# Patient Record
Sex: Male | Born: 1965 | Race: White | Hispanic: No | Marital: Married | Smoking: Never smoker
Health system: Southern US, Community
[De-identification: ages and names within clinical notes are randomized; demographics above are authoritative.]

## PROBLEM LIST (undated history)

## (undated) DIAGNOSIS — T4145XA Adverse effect of unspecified anesthetic, initial encounter: Secondary | ICD-10-CM

## (undated) DIAGNOSIS — M1990 Unspecified osteoarthritis, unspecified site: Secondary | ICD-10-CM

## (undated) DIAGNOSIS — K219 Gastro-esophageal reflux disease without esophagitis: Secondary | ICD-10-CM

## (undated) DIAGNOSIS — Z87442 Personal history of urinary calculi: Secondary | ICD-10-CM

## (undated) DIAGNOSIS — T8859XA Other complications of anesthesia, initial encounter: Secondary | ICD-10-CM

## (undated) DIAGNOSIS — G4730 Sleep apnea, unspecified: Secondary | ICD-10-CM

## (undated) DIAGNOSIS — S83209A Unspecified tear of unspecified meniscus, current injury, unspecified knee, initial encounter: Secondary | ICD-10-CM

## (undated) HISTORY — PX: OTHER SURGICAL HISTORY: SHX169

---

## 1987-06-24 HISTORY — PX: FOOT SURGERY: SHX648

## 1997-06-23 HISTORY — PX: KNEE ARTHROSCOPY: SUR90

## 2004-06-23 HISTORY — PX: APPENDECTOMY: SHX54

## 2014-05-16 ENCOUNTER — Ambulatory Visit: Payer: Self-pay | Admitting: Orthopedic Surgery

## 2014-05-16 NOTE — Progress Notes (Signed)
Preoperative surgical orders have been place into the Epic hospital system for Reginald Butler on 05/16/2014, 1:43 PM  by Patrica DuelPERKINS, ALEXZANDREW for surgery on 05-31-14.  Preop Knee Scope orders including IV Tylenol and IV Decadron as long as there are no contraindications to the above medications. Avel Peacerew Perkins, PA-C

## 2014-05-16 NOTE — Patient Instructions (Addendum)
Reginald Butler  05/16/2014                           YOUR PROCEDURE IS SCHEDULED ON:  05/31/14                ENTER FROM [address] - GO TO [address] DECK               LOOK FOR [facility]  / [employer]                              FOLLOW  SIGNS TO SHORT STAY CENTER                 ARRIVE AT SHORT STAY AT:  10:30 AM               CALL THIS NUMBER IF ANY PROBLEMS THE DAY OF SURGERY :               [phone]                                REMEMBER:   Do not eat food or drink liquids AFTER MIDNIGHT                  Take these medicines the morning of surgery with               A SIPS OF WATER :  OMEPRAZOLE              BRING C PAP MASK AND TUBING TO HOSPITAL       Do not wear jewelry, make-up   Do not wear lotions, powders, or perfumes.   Do not shave legs or underarms 12 hrs. before surgery (men may shave face)  Do not bring valuables to the hospital.  Contacts, dentures or bridgework may not be worn into surgery.  Leave suitcase in the car. After surgery it may be brought to your room.  For patients admitted to the hospital more than one night, checkout time is            11:00 AM                                                       The day of discharge.   Patients discharged the day of surgery will not be allowed to drive home.            If going home same day of surgery, must have someone stay with you              FIRST 24 hrs at home and arrange for some one to drive you              home from hospital.   ________________________________________________________________________  [facility] - PREPARING FOR SURGERY  Before surgery, you can play an important role.  Because skin is not sterile, your skin needs to be as free of germs as possible.  You can reduce the number of germs on your skin by washing with CHG (chlorahexidine gluconate) soap before  surgery.  CHG is an antiseptic cleaner which kills germs and bonds with the skin to continue killing germs even after washing. Please DO NOT use if you have an allergy to CHG or antibacterial soaps.  If your skin becomes reddened/irritated stop using the CHG and inform your nurse when you arrive at Short Stay. Do not shave (including legs and underarms) for at least 48 hours prior to the first CHG shower.  You may shave your face. Please follow these instructions carefully:   1.  Shower with CHG Soap the night before surgery and the  morning of Surgery.   2.  If you choose to wash your hair, wash your hair first as usual with your  normal  Shampoo.   3.  After you shampoo, rinse your hair and body thoroughly to remove the  shampoo.                                         4.  Use CHG as you would any other liquid soap.  You can apply chg directly  to the skin and wash . Gently wash with scrungie or clean wascloth    5.  Apply the CHG Soap to your body ONLY FROM THE NECK DOWN.   Do not use on open                           Wound or open sores. Avoid contact with eyes, ears mouth and genitals (private parts).                        Genitals (private parts) with your normal soap.              6.  Wash thoroughly, paying special attention to the area where your surgery  will be performed.   7.  Thoroughly rinse your body with warm water from the neck down.   8.  DO NOT shower/wash with your normal soap after using and rinsing off  the CHG Soap .                9.  Pat yourself dry with a clean towel.             10.  Wear clean pajamas.             11.  Place clean sheets on your bed the night of your first shower and do not  sleep with pets.  Day of Surgery : Do not apply any lotions/deodorants the morning of surgery.  Please wear clean clothes to the hospital/surgery center.  FAILURE TO FOLLOW THESE INSTRUCTIONS MAY RESULT IN THE CANCELLATION OF YOUR SURGERY    PATIENT  SIGNATURE_________________________________  ______________________________________________________________________     Reginald MireIncentive Spirometer  An incentive spirometer is a tool that can help keep your lungs clear and active. This tool measures how well you are filling your lungs with each breath. Taking long deep breaths may help reverse or decrease the chance of developing breathing (pulmonary) problems (especially infection) following:  A long period of time when you are unable to move or be active. BEFORE THE PROCEDURE   If the spirometer includes an indicator to show your best effort, your nurse or respiratory therapist will set it to a desired goal.  If possible, sit up straight or lean slightly forward. Try not to slouch.  Hold the incentive spirometer in an upright position. INSTRUCTIONS FOR USE  1. Sit on the edge of your bed if possible, or sit up as far as you can in bed or on a chair. 2. Hold the incentive spirometer in an upright position. 3. Breathe out normally. 4. Place the mouthpiece in your mouth and seal your lips tightly around it. 5. Breathe in slowly and as deeply as possible, raising the piston or the ball toward the top of the column. 6. Hold your breath for 3-5 seconds or for as long as possible. Allow the piston or ball to fall to the bottom of the column. 7. Remove the mouthpiece from your mouth and breathe out normally. 8. Rest for a few seconds and repeat Steps 1 through 7 at least 10 times every 1-2 hours when you are awake. Take your time and take a few normal breaths between deep breaths. 9. The spirometer may include an indicator to show your best effort. Use the indicator as a goal to work toward during each repetition. 10. After each set of 10 deep breaths, practice coughing to be sure your lungs are clear. If you have an incision (the cut made at the time of surgery), support your incision when coughing by placing a pillow or rolled up towels firmly  against it. Once you are able to get out of bed, walk around indoors and cough well. You may stop using the incentive spirometer when instructed by your caregiver.  RISKS AND COMPLICATIONS  Take your time so you do not get dizzy or light-headed.  If you are in pain, you may need to take or ask for pain medication before doing incentive spirometry. It is harder to take a deep breath if you are having pain. AFTER USE  Rest and breathe slowly and easily.  It can be helpful to keep track of a log of your progress. Your caregiver can provide you with a simple table to help with this. If you are using the spirometer at home, follow these instructions: [place] IF:   You are having difficultly using the spirometer.  You have trouble using the spirometer as often as instructed.  Your pain medication is not giving enough relief while using the spirometer.  You develop fever of 100.5 F (38.1 C) or higher. SEEK IMMEDIATE MEDICAL CARE IF:   You cough up bloody sputum that had not been present before.  You develop fever of 102 F (38.9 C) or greater.  You develop worsening pain at or near the incision site. MAKE SURE YOU:   Understand these instructions.  Will watch your condition.  Will get help right away if you are not doing well or get worse. Document Released: 10/20/2006 Document Revised: 09/01/2011 Document Reviewed: 12/21/2006 [employer] Patient Information 2014 [place], [place].   ________________________________________________________________________

## 2014-05-17 ENCOUNTER — Encounter (HOSPITAL_COMMUNITY): Payer: Self-pay

## 2014-05-17 ENCOUNTER — Encounter (HOSPITAL_COMMUNITY)
Admission: RE | Admit: 2014-05-17 | Discharge: 2014-05-17 | Disposition: A | Discharge status: Home / Self Care | Payer: BC Managed Care – PPO | Source: Ambulatory Visit | Attending: Orthopedic Surgery | Admitting: Orthopedic Surgery

## 2014-05-17 DIAGNOSIS — Z01818 Encounter for other preprocedural examination: Secondary | ICD-10-CM | POA: Insufficient documentation

## 2014-05-17 HISTORY — DX: Unspecified osteoarthritis, unspecified site: M19.90

## 2014-05-17 HISTORY — DX: Sleep apnea, unspecified: G47.30

## 2014-05-17 HISTORY — DX: Other complications of anesthesia, initial encounter: T88.59XA

## 2014-05-17 HISTORY — DX: Adverse effect of unspecified anesthetic, initial encounter: T41.45XA

## 2014-05-17 HISTORY — DX: Unspecified tear of unspecified meniscus, current injury, unspecified knee, initial encounter: S83.209A

## 2014-05-17 HISTORY — DX: Personal history of urinary calculi: Z87.442

## 2014-05-17 HISTORY — DX: Gastro-esophageal reflux disease without esophagitis: K21.9

## 2014-05-30 MED ORDER — DEXTROSE 5 % IV SOLN
3.0000 g | INTRAVENOUS | Status: AC
  Administered 2014-05-31: 2 g via INTRAVENOUS
  Filled 2014-05-30 (×2): qty 3000

## 2014-05-31 ENCOUNTER — Ambulatory Visit (HOSPITAL_COMMUNITY): Payer: BC Managed Care – PPO | Admitting: Anesthesiology

## 2014-05-31 ENCOUNTER — Encounter (HOSPITAL_COMMUNITY): Payer: Self-pay | Admitting: *Deleted

## 2014-05-31 ENCOUNTER — Ambulatory Visit (HOSPITAL_COMMUNITY)
Admission: RE | Admit: 2014-05-31 | Discharge: 2014-05-31 | Disposition: A | Discharge status: Home / Self Care | Payer: BC Managed Care – PPO | Source: Ambulatory Visit | Attending: Orthopedic Surgery | Admitting: Orthopedic Surgery

## 2014-05-31 ENCOUNTER — Encounter (HOSPITAL_COMMUNITY)
Admission: RE | Disposition: A | Discharge status: Home / Self Care | Payer: Self-pay | Source: Ambulatory Visit | Attending: Orthopedic Surgery

## 2014-05-31 DIAGNOSIS — G4730 Sleep apnea, unspecified: Secondary | ICD-10-CM | POA: Insufficient documentation

## 2014-05-31 DIAGNOSIS — M2342 Loose body in knee, left knee: Secondary | ICD-10-CM | POA: Insufficient documentation

## 2014-05-31 DIAGNOSIS — Y999 Unspecified external cause status: Secondary | ICD-10-CM | POA: Diagnosis not present

## 2014-05-31 DIAGNOSIS — S83249A Other tear of medial meniscus, current injury, unspecified knee, initial encounter: Secondary | ICD-10-CM | POA: Diagnosis present

## 2014-05-31 DIAGNOSIS — X58XXXA Exposure to other specified factors, initial encounter: Secondary | ICD-10-CM | POA: Diagnosis not present

## 2014-05-31 DIAGNOSIS — S83242A Other tear of medial meniscus, current injury, left knee, initial encounter: Secondary | ICD-10-CM | POA: Diagnosis not present

## 2014-05-31 DIAGNOSIS — M94262 Chondromalacia, left knee: Secondary | ICD-10-CM | POA: Insufficient documentation

## 2014-05-31 DIAGNOSIS — Y939 Activity, unspecified: Secondary | ICD-10-CM | POA: Diagnosis not present

## 2014-05-31 DIAGNOSIS — Y929 Unspecified place or not applicable: Secondary | ICD-10-CM | POA: Insufficient documentation

## 2014-05-31 DIAGNOSIS — Z6837 Body mass index (BMI) 37.0-37.9, adult: Secondary | ICD-10-CM | POA: Diagnosis not present

## 2014-05-31 DIAGNOSIS — K219 Gastro-esophageal reflux disease without esophagitis: Secondary | ICD-10-CM | POA: Insufficient documentation

## 2014-05-31 HISTORY — PX: KNEE ARTHROSCOPY: SHX127

## 2014-05-31 SURGERY — ARTHROSCOPY, KNEE
Anesthesia: General | Site: Knee | Laterality: Left

## 2014-05-31 MED ORDER — ACETAMINOPHEN 10 MG/ML IV SOLN
1000.0000 mg | Freq: Once | INTRAVENOUS | Status: AC
  Administered 2014-05-31: 1000 mg via INTRAVENOUS
  Filled 2014-05-31: qty 100

## 2014-05-31 MED ORDER — ONDANSETRON HCL 4 MG/2ML IJ SOLN
INTRAMUSCULAR | Status: AC
  Filled 2014-05-31: qty 2

## 2014-05-31 MED ORDER — BUPIVACAINE-EPINEPHRINE (PF) 0.25% -1:200000 IJ SOLN
INTRAMUSCULAR | Status: AC
  Filled 2014-05-31: qty 30

## 2014-05-31 MED ORDER — FENTANYL CITRATE 0.05 MG/ML IJ SOLN
INTRAMUSCULAR | Status: DC
Start: 2014-05-31 — End: 2014-05-31
  Filled 2014-05-31: qty 2

## 2014-05-31 MED ORDER — METHOCARBAMOL 500 MG PO TABS: 500.0000 mg | ORAL_TABLET | Freq: Four times a day (QID) | ORAL | Status: DC

## 2014-05-31 MED ORDER — MIDAZOLAM HCL 5 MG/5ML IJ SOLN
INTRAMUSCULAR | Status: DC | PRN
  Administered 2014-05-31: 2 mg via INTRAVENOUS

## 2014-05-31 MED ORDER — LACTATED RINGERS IR SOLN
Status: DC | PRN
  Administered 2014-05-31: 9000 mL

## 2014-05-31 MED ORDER — DEXAMETHASONE SODIUM PHOSPHATE 10 MG/ML IJ SOLN
INTRAMUSCULAR | Status: AC
  Filled 2014-05-31: qty 1

## 2014-05-31 MED ORDER — FENTANYL CITRATE 0.05 MG/ML IJ SOLN
INTRAMUSCULAR | Status: AC
  Filled 2014-05-31: qty 2

## 2014-05-31 MED ORDER — LIDOCAINE HCL (CARDIAC) 20 MG/ML IV SOLN
INTRAVENOUS | Status: AC
  Filled 2014-05-31: qty 5

## 2014-05-31 MED ORDER — FENTANYL CITRATE 0.05 MG/ML IJ SOLN
25.0000 ug | INTRAMUSCULAR | Status: DC | PRN
  Administered 2014-05-31: 50 ug via INTRAVENOUS

## 2014-05-31 MED ORDER — SODIUM CHLORIDE 0.9 % IV SOLN: INTRAVENOUS | Status: DC

## 2014-05-31 MED ORDER — PROPOFOL 10 MG/ML IV BOLUS
INTRAVENOUS | Status: AC
  Filled 2014-05-31: qty 20

## 2014-05-31 MED ORDER — LIDOCAINE HCL (CARDIAC) 20 MG/ML IV SOLN
INTRAVENOUS | Status: DC | PRN
  Administered 2014-05-31: 50 mg via INTRAVENOUS

## 2014-05-31 MED ORDER — FENTANYL CITRATE 0.05 MG/ML IJ SOLN
INTRAMUSCULAR | Status: DC | PRN
  Administered 2014-05-31: 25 ug via INTRAVENOUS
  Administered 2014-05-31: 50 ug via INTRAVENOUS

## 2014-05-31 MED ORDER — BUPIVACAINE-EPINEPHRINE 0.25% -1:200000 IJ SOLN
INTRAMUSCULAR | Status: DC | PRN
  Administered 2014-05-31: 20 mL

## 2014-05-31 MED ORDER — LACTATED RINGERS IV SOLN
INTRAVENOUS | Status: DC
  Administered 2014-05-31 (×2): 1000 mL via INTRAVENOUS

## 2014-05-31 MED ORDER — HYDROCODONE-ACETAMINOPHEN 5-325 MG PO TABS: 1.0000 | ORAL_TABLET | Freq: Four times a day (QID) | ORAL | Status: DC | PRN

## 2014-05-31 MED ORDER — CHLORHEXIDINE GLUCONATE 4 % EX LIQD: 60.0000 mL | Freq: Once | CUTANEOUS | Status: DC

## 2014-05-31 MED ORDER — DEXAMETHASONE SODIUM PHOSPHATE 10 MG/ML IJ SOLN
10.0000 mg | Freq: Once | INTRAMUSCULAR | Status: AC
  Administered 2014-05-31: 10 mg via INTRAVENOUS

## 2014-05-31 MED ORDER — METHOCARBAMOL 500 MG PO TABS: 500.0000 mg | ORAL_TABLET | Freq: Four times a day (QID) | ORAL | Status: DC | PRN

## 2014-05-31 MED ORDER — PROMETHAZINE HCL 25 MG/ML IJ SOLN: 6.2500 mg | INTRAMUSCULAR | Status: DC | PRN

## 2014-05-31 MED ORDER — KETOROLAC TROMETHAMINE 30 MG/ML IJ SOLN
15.0000 mg | Freq: Once | INTRAMUSCULAR | Status: AC | PRN
  Administered 2014-05-31: 30 mg via INTRAVENOUS
  Filled 2014-05-31: qty 1

## 2014-05-31 MED ORDER — MIDAZOLAM HCL 2 MG/2ML IJ SOLN
INTRAMUSCULAR | Status: AC
  Filled 2014-05-31: qty 2

## 2014-05-31 MED ORDER — PROPOFOL 10 MG/ML IV BOLUS
INTRAVENOUS | Status: DC | PRN
  Administered 2014-05-31: 300 mg via INTRAVENOUS

## 2014-05-31 SURGICAL SUPPLY — 26 items
BANDAGE ELASTIC 6 VELCRO ST LF (GAUZE/BANDAGES/DRESSINGS) ×3 IMPLANT
BLADE 4.2CUDA (BLADE) ×3 IMPLANT
BLADE SURG SZ11 CARB STEEL (BLADE) ×2 IMPLANT
CUFF TOURN SGL QUICK 34 (TOURNIQUET CUFF) ×3
CUFF TRNQT CYL 34X4X40X1 (TOURNIQUET CUFF) ×1 IMPLANT
DRAPE U-SHAPE 47X51 STRL (DRAPES) ×3 IMPLANT
DRSG EMULSION OIL 3X3 NADH (GAUZE/BANDAGES/DRESSINGS) ×3 IMPLANT
DRSG PAD ABDOMINAL 8X10 ST (GAUZE/BANDAGES/DRESSINGS) ×3 IMPLANT
DURAPREP 26ML APPLICATOR (WOUND CARE) ×3 IMPLANT
GAUZE SPONGE 4X4 12PLY STRL (GAUZE/BANDAGES/DRESSINGS) ×3 IMPLANT
GLOVE BIO SURGEON STRL SZ8 (GLOVE) ×3 IMPLANT
GLOVE BIOGEL PI IND STRL 8 (GLOVE) ×1 IMPLANT
GLOVE BIOGEL PI INDICATOR 8 (GLOVE) ×6
GOWN STRL REUS W/TWL LRG LVL3 (GOWN DISPOSABLE) ×5 IMPLANT
KIT BASIN OR (CUSTOM PROCEDURE TRAY) ×3 IMPLANT
MANIFOLD NEPTUNE II (INSTRUMENTS) ×3 IMPLANT
PACK ARTHROSCOPY WL (CUSTOM PROCEDURE TRAY) ×3 IMPLANT
PACK ICE MAXI GEL EZY WRAP (MISCELLANEOUS) ×9 IMPLANT
PADDING CAST COTTON 6X4 STRL (CAST SUPPLIES) ×6 IMPLANT
POSITIONER SURGICAL ARM (MISCELLANEOUS) ×3 IMPLANT
SET ARTHROSCOPY TUBING (MISCELLANEOUS) ×3
SET ARTHROSCOPY TUBING LN (MISCELLANEOUS) ×1 IMPLANT
SUT ETHILON 4 0 PS 2 18 (SUTURE) ×3 IMPLANT
TOWEL OR 17X26 10 PK STRL BLUE (TOWEL DISPOSABLE) ×3 IMPLANT
WAND 90 DEG TURBOVAC W/CORD (SURGICAL WAND) ×2 IMPLANT
WRAP KNEE MAXI GEL POST OP (GAUZE/BANDAGES/DRESSINGS) ×3 IMPLANT

## 2014-05-31 NOTE — Anesthesia Postprocedure Evaluation (Signed)
  Anesthesia Post-op Note  Patient: Reginald Butler  Procedure(s) Performed: Procedure(s) (LRB): LEFT ARTHROSCOPY KNEE WITH MEDIAL MENISCAL  DEBRIDMENT, CHONDROPLASTY (Left)  Patient Location: PACU  Anesthesia Type: General  Level of Consciousness: awake and alert   Airway and Oxygen Therapy: Patient Spontanous Breathing  Post-op Pain: mild  Post-op Assessment: Post-op Vital signs reviewed, Patient's Cardiovascular Status Stable, Respiratory Function Stable, Patent Airway and No signs of Nausea or vomiting  Last Vitals:  Filed Vitals:   05/31/14 1415  BP: 134/85  Pulse: 71  Temp:   Resp: 13    Post-op Vital Signs: stable   Complications: No apparent anesthesia complications

## 2014-05-31 NOTE — Op Note (Signed)
Preoperative diagnosis-  Left knee medial meniscal tear  Postoperative diagnosis Left- knee medial meniscal tear plus  Chondral defect  Procedure- Left knee arthroscopy with medial  meniscal debridement and chondroplasty   Surgeon- Gus RankinFrank V. Eliyana Pagliaro, MD  Anesthesia-General  EBL-  Minimal  Complications- None  Condition- PACU - hemodynamically stable.  Brief clinical note- -Reginald Butler is a 48 y.o.  male with a several month history of left knee pain and mechanical symptoms. Exam and history suggested medial meniscal tear confirmed by MRI. The patient presents now for arthroscopy and debridement   Procedure in detail -       After successful administration of General anesthetic, a tourmiquet is placed high on the Left  thigh and the Left lower extremity is prepped and draped in the usual sterile fashion. Time out is performed by the surgical team. Standard superomedial and inferolateral portal sites are marked and incisions made with an 11 blade. The inflow cannula is passed through the superomedial portal and camera through the inferolateral portal and inflow is initiated. Arthroscopic visualization proceeds.      The undersurface of the patella and trochlea are visualized and there is mild chondromalacia but no chondral defects.. The medial and lateral gutters are visualized and there are several small loose bodies which were subsequently removed. Flexion and valgus force is applied to the knee and the medial compartment is entered. A spinal needle is passed into the joint through the site marked for the inferomedial portal. A small incision is made and the dilator passed into the joint. The findings for the medial compartment are 2 x 2 cm area of exposed bone medial femoral condyle with 1 x 2 cm area of full thickness unstable chondral lesion and a posterior horn medial meniscal tear . The tear is debrided to a stable base with baskets and a shaver and sealed off with the Arthrocare.  The shaver is used to debride the unstable cartilage to a stable bony base with stable edges. It is probed and found to be stable. The exposed bone is abraded with the shaver    The intercondylar notch is visualized and the ACL appears normal. The lateral compartment is entered and the findings are normal .      The joint is again inspected and there are no other tears, defects or loose bodies identified. The arthroscopic equipment is then removed from the inferior portals which are closed with interrupted 4-0 nylon. 20 ml of .25% Marcaine with epinephrine are injected through the inflow cannula and the cannula is then removed and the portal closed with nylon. The incisions are cleaned and dried and a bulky sterile dressing is applied. The patient is then awakened and transported to recovery in stable condition.   05/31/2014, 1:52 PM

## 2014-05-31 NOTE — Transfer of Care (Signed)
Immediate Anesthesia Transfer of Care Note  Patient: Reginald Butler  Procedure(s) Performed: Procedure(s): LEFT ARTHROSCOPY KNEE WITH MEDIAL MENISCAL  DEBRIDMENT, CHONDROPLASTY (Left)  Patient Location: PACU  Anesthesia Type:General  Level of Consciousness: awake, alert  and oriented  Airway & Oxygen Therapy: Patient Spontanous Breathing and Patient connected to face mask oxygen  Post-op Assessment: Report given to PACU RN and Post -op Vital signs reviewed and stable  Post vital signs: Reviewed and stable  Complications: No apparent anesthesia complications

## 2014-05-31 NOTE — Anesthesia Preprocedure Evaluation (Signed)
Anesthesia Evaluation  Patient identified by MRN, date of birth, ID band Patient awake    Reviewed: Allergy & Precautions, H&P , NPO status , Patient's Chart, lab work & pertinent test results  Airway Mallampati: II  TM Distance: >3 FB Neck ROM: Full    Dental no notable dental hx.    Pulmonary sleep apnea and Continuous Positive Airway Pressure Ventilation ,  breath sounds clear to auscultation  Pulmonary exam normal       Cardiovascular negative cardio ROS  Rhythm:Regular Rate:Normal     Neuro/Psych negative neurological ROS  negative psych ROS   GI/Hepatic Neg liver ROS, GERD-  Medicated,  Endo/Other  Morbid obesity  Renal/GU negative Renal ROS  negative genitourinary   Musculoskeletal negative musculoskeletal ROS (+)   Abdominal   Peds negative pediatric ROS (+)  Hematology negative hematology ROS (+)   Anesthesia Other Findings   Reproductive/Obstetrics negative OB ROS                             Anesthesia Physical Anesthesia Plan  ASA: III  Anesthesia Plan: General   Post-op Pain Management:    Induction: Intravenous  Airway Management Planned: LMA  Additional Equipment:   Intra-op Plan:   Post-operative Plan: Extubation in OR  Informed Consent: I have reviewed the patients History and Physical, chart, labs and discussed the procedure including the risks, benefits and alternatives for the proposed anesthesia with the patient or authorized representative who has indicated his/her understanding and acceptance.   Dental advisory given  Plan Discussed with: CRNA and Surgeon  Anesthesia Plan Comments:         Anesthesia Quick Evaluation

## 2014-05-31 NOTE — Interval H&P Note (Signed)
History and Physical Interval Note:  05/31/2014 12:54 PM  Reginald Butler  has presented today for surgery, with the diagnosis of LEFT KNEE MEDIAL MENISCUS TEAR  The various methods of treatment have been discussed with the patient and family. After consideration of risks, benefits and other options for treatment, the patient has consented to  Procedure(s): LEFT ARTHROSCOPY KNEE WITH DEBRIDMENT (Left) as a surgical intervention .  The patient's history has been reviewed, patient examined, no change in status, stable for surgery.  I have reviewed the patient's chart and labs.  Questions were answered to the patient's satisfaction.     Reginald Butler,Reginald Butler

## 2014-05-31 NOTE — H&P (Signed)
CC- Reginald Butler is a 48 y.o. male who presents with left knee pain.  HPI- . Knee Pain: Patient presents with knee pain involving the  left knee. Onset of the symptoms was several months ago. Inciting event: none known. Current symptoms include giving out, pain located medially, stiffness and swelling. Pain is aggravated by lateral movements, pivoting, rising after sitting and walking.  Patient has had prior knee problems. Evaluation to date: MRI: abnormal medial meniscal tear. Treatment to date: rest.  Past Medical History  Diagnosis Date  . Complication of anesthesia     MAY BE SLOW TO WAKE UP  . Sleep apnea     USES C PAP  . Acute meniscal tear of knee     LEFT  . Arthritis   . GERD (gastroesophageal reflux disease)   . History of kidney stones     Past Surgical History  Procedure Laterality Date  . Appendectomy  2006  . Foot surgery      LEFT  . Knee arthroscopy  1999    LEFT  . Cataracts removed      Prior to Admission medications   Medication Sig Start Date End Date Taking? Authorizing Provider  Aspirin-Caffeine (BAYER BACK & BODY PAIN EX ST PO) Take 2 tablets by mouth 2 (two) times daily as needed (pain).   Yes Historical Provider, MD  B Complex-C (SUPER B COMPLEX PO) Take 1 tablet by mouth every morning.   Yes Historical Provider, MD  Biotin (BIOTIN 5000) 5 MG CAPS Take 1 capsule by mouth every morning.   Yes Historical Provider, MD  CINNAMON PO Take 2 tablets by mouth 2 (two) times daily.   Yes Historical Provider, MD  CRANBERRY PO Take 3,600 mg by mouth every morning.   Yes Historical Provider, MD  fexofenadine (ALLEGRA) 180 MG tablet Take 180 mg by mouth every morning.   Yes Historical Provider, MD  Flaxseed, Linseed, (FLAXSEED OIL MAX STR) 1300 MG CAPS Take 2 capsules by mouth 2 (two) times daily. 1300 mg   Yes Historical Provider, MD  Misc Natural Products (HORNY GOAT WEED PO) Take 1 tablet by mouth every morning.   Yes Historical Provider, MD  Naproxen  Sodium (ALEVE) 220 MG CAPS Take 220 mg by mouth 2 (two) times daily as needed (pain).   Yes Historical Provider, MD  omeprazole (PRILOSEC) 20 MG capsule Take 20 mg by mouth every morning.   Yes Historical Provider, MD  OVER THE COUNTER MEDICATION Take 2 tablets by mouth 2 (two) times daily. Natural Pain Medicine.--Curamin   Yes Historical Provider, MD  Probiotic Product (PROBIOTIC DAILY PO) Take 1 tablet by mouth every morning.   Yes Historical Provider, MD  Psyllium (VEGETABLE LAXATIVE PO) Take 1-5 tablets by mouth 2 (two) times daily. 3 in the morning - 2 at night.   Yes Historical Provider, MD  Red Yeast Rice Extract (RED YEAST RICE PO) Take 2 tablets by mouth 2 (two) times daily. 1400 mg   Yes Historical Provider, MD  Saw Palmetto, Serenoa repens, (SAW PALMETTO PO) Take 1 tablet by mouth every morning.   Yes Historical Provider, MD   KNEE EXAM antalgic gait, soft tissue tenderness over medial joint line, no effusion, negative drawer sign, collateral ligaments intact  Physical Examination: General appearance - alert, well appearing, and in no distress Mental status - alert, oriented to person, place, and time Chest - clear to auscultation, no wheezes, rales or rhonchi, symmetric air entry Heart - normal rate, regular rhythm,  normal S1, S2, no murmurs, rubs, clicks or gallops Abdomen - soft, nontender, nondistended, no masses or organomegaly Neurological - alert, oriented, normal speech, no focal findings or movement disorder noted    Asessment/Plan--- Left knee medial meniscal tear- - Plan left knee arthroscopy with meniscal debridement. Procedure risks and potential comps discussed with patient who elects to proceed. Goals are decreased pain and increased function with a high likelihood of achieving both

## 2014-05-31 NOTE — Discharge Instructions (Signed)
Arthroscopic Procedure, Knee °An arthroscopic procedure can find what is wrong with your knee. °PROCEDURE °Arthroscopy is a surgical technique that allows your orthopedic surgeon to diagnose and treat your knee injury with accuracy. They will look into your knee through a small instrument. This is almost like a small (pencil sized) telescope. Because arthroscopy affects your knee less than open knee surgery, you can anticipate a more rapid recovery. Taking an active role by following your caregiver's instructions will help with rapid and complete recovery. Use crutches, rest, elevation, ice, and knee exercises as instructed. The length of recovery depends on various factors including type of injury, age, physical condition, medical conditions, and your rehabilitation. °Your knee is the joint between the large bones (femur and tibia) in your leg. Cartilage covers these bone ends which are smooth and slippery and allow your knee to bend and move smoothly. Two menisci, thick, semi-lunar shaped pads of cartilage which form a rim inside the joint, help absorb shock and stabilize your knee. Ligaments bind the bones together and support your knee joint. Muscles move the joint, help support your knee, and take stress off the joint itself. Because of this all programs and physical therapy to rehabilitate an injured or repaired knee require rebuilding and strengthening your muscles. °AFTER THE PROCEDURE °· After the procedure, you will be moved to a recovery area until most of the effects of the medication have worn off. Your caregiver will discuss the test results with you.  °· Only take over-the-counter or prescription medicines for pain, discomfort, or fever as directed by your caregiver.  °SEEK MEDICAL CARE IF:  °· You have increased bleeding from your wounds.  °· You see redness, swelling, or have increasing pain in your wounds.  °· You have pus coming from your wound.  °· You have an oral temperature above 102° F (38.9°  C).  °· You notice a bad smell coming from the wound or dressing.  °· You have severe pain with any motion of your knee.  °SEEK IMMEDIATE MEDICAL CARE IF:  °· You develop a rash.  °· You have difficulty breathing.  °· You have any allergic problems.  °FURTHER INSTRUCTIONS: °· You may start showering two days after being discharged home but do not submerge the incisions under water.  °· Change dressing 48 hours after the procedure and then cover the small incisions with band aids until your follow up visit. °· Avoid periods of inactivity such as sitting longer than an hour when not asleep. This helps prevent blood clots.  °· You may put full weight on your legs and walk as much as is comfortable.  °· Do not drive while taking narcotics.  °Wear the elastic stockings for three weeks following surgery during the day but you may remove then at night. °· Make sure you keep all of your appointments after your operation with all of your doctors and caregivers. You should call the office at [phone] and make an appointment for approximately one week after the date of your surgery. °· Please pick up a stool softener and laxative for home use as long as you are requiring pain medications. °· ICE to the affected knee every three hours for 30 minutes at a time and then as needed for pain and swelling.  Continue to use ice on the knee for pain and swelling from surgery. You may notice swelling that will progress down to the foot and ankle.  This is normal after surgery.  Elevate the   leg when you are not up walking on it.   °RANGE OF MOTION AND STRENGTHENING EXERCISES  °Rehabilitation of the knee is important following a knee injury or an operation. After just a few days of immobilization, the muscles of the thigh which control the knee become weakened and shrink (atrophy). Knee exercises are designed to build up the tone and strength of the thigh muscles and to improve knee motion. Often times heat used for twenty to thirty  minutes before working out will loosen up your tissues and help with improving the range of motion but do not use heat for the first two weeks following surgery. These exercises can be done on a training (exercise) mat, on the floor, on a table or on a bed. Use what ever works the best and is most comfortable for you Knee exercises include: ° ° ° ° ° ° °QUAD STRENGTHENING EXERCISES °Strengthening Quadriceps Sets ° °Tighten muscles on top of thigh by pushing knees down into floor or table. °Hold for 20 seconds. Repeat 10 times. °Do 2 sessions per day. ° ° ° °Strengthening Terminal Knee Extension ° °With knee bent over bolster, straighten knee by tightening muscle on top of thigh. Be sure to keep bottom of knee on bolster. °Hold for 20 seconds. Repeat 10 times. °Do 2 sessions per day. ° ° °Straight Leg with Bent Knee ° °Lie on back with opposite leg bent. Keep involved knee slightly bent at knee and raise leg 4-6". Hold for 10 seconds. °Repeat 20 times per set. °Do 2 sets per session. °Do 2 sessions per day. ° ° ° °General Anesthesia, Care After °Refer to this sheet in the next few weeks. These instructions provide you with information on caring for yourself after your procedure. Your health care provider may also give you more specific instructions. Your treatment has been planned according to current medical practices, but problems sometimes occur. Call your health care provider if you have any problems or questions after your procedure. °WHAT TO EXPECT AFTER THE PROCEDURE °After the procedure, it is typical to experience: °· Sleepiness. °· Nausea and vomiting. °HOME CARE INSTRUCTIONS °· For the first 24 hours after general anesthesia: °¨ Have a responsible person with you. °¨ Do not drive a car. If you are alone, do not take public transportation. °¨ Do not drink alcohol. °¨ Do not take medicine that has not been prescribed by your health care provider. °¨ Do not sign important papers or make important  decisions. °¨ You may resume a normal diet and activities as directed by your health care provider. °· Change bandages (dressings) as directed. °· If you have questions or problems that seem related to general anesthesia, call the hospital and ask for the anesthetist or anesthesiologist on call. °SEEK MEDICAL CARE IF: °· You have nausea and vomiting that continue the day after anesthesia. °· You develop a rash. °SEEK IMMEDIATE MEDICAL CARE IF:  °· You have difficulty breathing. °· You have chest pain. °· You have any allergic problems. °Document Released: 09/15/2000 Document Revised: 06/14/2013 Document Reviewed: 12/23/2012 °Exit[employer]® Patient Information ©2015 [employer]. This information is not intended to replace advice given to you by your health care provider. Make sure you discuss any questions you have with your health care provider. ° °

## 2014-06-01 ENCOUNTER — Encounter (HOSPITAL_COMMUNITY): Payer: Self-pay | Admitting: Orthopedic Surgery

## 2015-01-01 ENCOUNTER — Other Ambulatory Visit: Payer: Self-pay | Admitting: Orthopedic Surgery

## 2015-01-01 DIAGNOSIS — M1712 Unilateral primary osteoarthritis, left knee: Secondary | ICD-10-CM

## 2015-01-04 ENCOUNTER — Ambulatory Visit
Admission: RE | Admit: 2015-01-04 | Discharge: 2015-01-04 | Disposition: A | Discharge status: Home / Self Care | Payer: BLUE CROSS/BLUE SHIELD | Source: Ambulatory Visit | Attending: Orthopedic Surgery | Admitting: Orthopedic Surgery

## 2015-01-04 DIAGNOSIS — M1712 Unilateral primary osteoarthritis, left knee: Secondary | ICD-10-CM

## 2015-04-17 ENCOUNTER — Ambulatory Visit: Payer: Self-pay | Admitting: Orthopedic Surgery

## 2015-04-17 ENCOUNTER — Other Ambulatory Visit (HOSPITAL_COMMUNITY): Payer: Self-pay | Admitting: *Deleted

## 2015-04-17 NOTE — Progress Notes (Signed)
Preoperative surgical orders have been place into the [facility] hospital system for Francoise Ceoimothy A Ficco on 04/17/2015, 1:38 PM  by Patrica DuelPERKINS, ALEXZANDREW for surgery on 04-30-15.  Preop Uni Knee orders including Experal, IV Tylenol, and IV Decadron as long as there are no contraindications to the above medications. Avel Peacerew Perkins, PA-C

## 2015-04-17 NOTE — Patient Instructions (Addendum)
Reginald Butler  04/17/2015   Your procedure is scheduled on: 04-30-15  [facility] to 3rd floor to  Short Stay Center at 1150 AM.  Call this number if you have problems the morning of surgery [phone]   Remember: ONLY 1 PERSON MAY GO WITH YOU TO SHORT STAY TO GET  READY MORNING OF YOUR SURGERY.  Do not eat food :After Midnight.clear liquids midnight until 850 am, nothing by mouth after 850 am day of surgery.  BRING CPAP MASK AND TUBING   Take these medicines the morning of surgery with A SIP OF WATER: ompprazole (prilosec), fexofenadine (allegra)                               You may not have any metal on your body including hair pins and              piercings  Do not wear jewelry, make-up, lotions, powders or perfumes, deodorant             Do not wear nail polish.  Do not shave  48 hours prior to surgery.              Men may shave face and neck.   Do not bring valuables to the hospital. [facility] IS NOT             RESPONSIBLE   FOR VALUABLES.  Contacts, dentures or bridgework may not be worn into surgery.  Leave suitcase in the car. After surgery it may be brought to your room.     Patients discharged the day of surgery will not be allowed to drive home.  Name and phone number of your driver:  Special Instructions: N/A              Please read over the following fact sheets you were given: _____________________________________________________________________             [facility] Before surgery, you can play an important role.  Because skin is not sterile, your skin needs to be as free of germs as possible.  You can reduce the number of germs on your skin by washing with CHG (chlorahexidine gluconate) soap before surgery.  CHG is an antiseptic cleaner which kills germs and bonds with the skin to continue killing germs even after washing. Please DO NOT use if you have an  allergy to CHG or antibacterial soaps.  If your skin becomes reddened/irritated stop using the CHG and inform your nurse when you arrive at Short Stay. Do not shave (including legs and underarms) for at least 48 hours prior to the first CHG shower.  You may shave your face/neck. Please follow these instructions carefully:  1.  Shower with CHG Soap the night before surgery and the  morning of Surgery.  2.  If you choose to wash your hair, wash your hair first as usual with your  normal  shampoo.  3.  After you shampoo, rinse your hair and body thoroughly to remove the  shampoo.                           4.  Use CHG as you would any other liquid soap.  You can apply chg directly  to the skin and wash                       Gently with a scrungie or clean washcloth.  5.  Apply the CHG Soap to your body ONLY FROM THE NECK DOWN.   Do not use on face/ open                           Wound or open sores. Avoid contact with eyes, ears mouth and genitals (private parts).                       Wash face,  Genitals (private parts) with your normal soap.             6.  Wash thoroughly, paying special attention to the area where your surgery  will be performed.  7.  Thoroughly rinse your body with warm water from the neck down.  8.  DO NOT shower/wash with your normal soap after using and rinsing off  the CHG Soap.                9.  Pat yourself dry with a clean towel.            10.  Wear clean pajamas.            11.  Place clean sheets on your bed the night of your first shower and do not  sleep with pets. Day of Surgery : Do not apply any lotions/deodorants the morning of surgery.  Please wear clean clothes to the hospital/surgery center.  FAILURE TO FOLLOW THESE INSTRUCTIONS MAY RESULT IN THE CANCELLATION OF YOUR SURGERY PATIENT SIGNATURE_________________________________  NURSE  SIGNATURE__________________________________  ________________________________________________________________________    CLEAR LIQUID DIET   Foods Allowed                                                                     Foods Excluded  Coffee and tea, regular and decaf                             liquids that you cannot  Plain Jell-O in any flavor                                             see through such as: Fruit ices (not with fruit pulp)                                     milk, soups, orange juice  Iced Popsicles                                    All solid food Carbonated beverages, regular and diet  Cranberry, grape and apple juices Sports drinks like Gatorade Lightly seasoned clear broth or consume(fat free) Sugar, honey syrup  Sample Menu Breakfast                                Lunch                                     Supper Cranberry juice                    Beef broth                            Chicken broth Jell-O                                     Grape juice                           Apple juice Coffee or tea                        Jell-O                                      Popsicle                                                Coffee or tea                        Coffee or tea  _____________________________________________________________________    Incentive Spirometer  An incentive spirometer is a tool that can help keep your lungs clear and active. This tool measures how well you are filling your lungs with each breath. Taking long deep breaths may help reverse or decrease the chance of developing breathing (pulmonary) problems (especially infection) following:  A long period of time when you are unable to move or be active. BEFORE THE PROCEDURE   If the spirometer includes an indicator to show your best effort, your nurse or respiratory therapist will set it to a desired goal.  If possible, sit up straight or lean  slightly forward. Try not to slouch.  Hold the incentive spirometer in an upright position. INSTRUCTIONS FOR USE   Sit on the edge of your bed if possible, or sit up as far as you can in bed or on a chair.  Hold the incentive spirometer in an upright position.  Breathe out normally.  Place the mouthpiece in your mouth and seal your lips tightly around it.  Breathe in slowly and as deeply as possible, raising the piston or the ball toward the top of the column.  Hold your breath for 3-5 seconds or for as long as possible. Allow the piston or ball to fall to the bottom of the column.  Remove the mouthpiece from your mouth and breathe out normally.  Rest for a few seconds and repeat Steps 1 through 7 at  least 10 times every 1-2 hours when you are awake. Take your time and take a few normal breaths between deep breaths.  The spirometer may include an indicator to show your best effort. Use the indicator as a goal to work toward during each repetition.  After each set of 10 deep breaths, practice coughing to be sure your lungs are clear. If you have an incision (the cut made at the time of surgery), support your incision when coughing by placing a pillow or rolled up towels firmly against it. Once you are able to get out of bed, walk around indoors and cough well. You may stop using the incentive spirometer when instructed by your caregiver.  RISKS AND COMPLICATIONS  Take your time so you do not get dizzy or light-headed.  If you are in pain, you may need to take or ask for pain medication before doing incentive spirometry. It is harder to take a deep breath if you are having pain. AFTER USE  Rest and breathe slowly and easily.  It can be helpful to keep track of a log of your progress. Your caregiver can provide you with a simple table to help with this. If you are using the spirometer at home, follow these instructions: [place] IF:   You are having difficultly using the  spirometer.  You have trouble using the spirometer as often as instructed.  Your pain medication is not giving enough relief while using the spirometer.  You develop fever of 100.5 F (38.1 C) or higher. SEEK IMMEDIATE MEDICAL CARE IF:   You cough up bloody sputum that had not been present before.  You develop fever of 102 F (38.9 C) or greater.  You develop worsening pain at or near the incision site. MAKE SURE YOU:   Understand these instructions.  Will watch your condition.  Will get help right away if you are not doing well or get worse. Document Released: 10/20/2006 Document Revised: 09/01/2011 Document Reviewed: 12/21/2006 ExitCare Patient Information 2014 [employer], [place].   ________________________________________________________________________  WHAT IS A BLOOD TRANSFUSION? Blood Transfusion Information  A transfusion is the replacement of blood or some of its parts. Blood is made up of multiple cells which provide different functions.  Red blood cells carry oxygen and are used for blood loss replacement.  White blood cells fight against infection.  Platelets control bleeding.  Plasma helps clot blood.  Other blood products are available for specialized needs, such as hemophilia or other clotting disorders. BEFORE THE TRANSFUSION  Who gives blood for transfusions?   Healthy volunteers who are fully evaluated to make sure their blood is safe. This is blood bank blood. Transfusion therapy is the safest it has ever been in the practice of medicine. Before blood is taken from a donor, a complete history is taken to make sure that person has no history of diseases nor engages in risky social behavior (examples are intravenous drug use or sexual activity with multiple partners). The donor's travel history is screened to minimize risk of transmitting infections, such as malaria. The donated blood is tested for signs of infectious diseases, such as HIV and hepatitis.  The blood is then tested to be sure it is compatible with you in order to minimize the chance of a transfusion reaction. If you or a relative donates blood, this is often done in anticipation of surgery and is not appropriate for emergency situations. It takes many days to process the donated blood. RISKS AND COMPLICATIONS Although transfusion  therapy is very safe and saves many lives, the main dangers of transfusion include:   Getting an infectious disease.  Developing a transfusion reaction. This is an allergic reaction to something in the blood you were given. Every precaution is taken to prevent this. The decision to have a blood transfusion has been considered carefully by your caregiver before blood is given. Blood is not given unless the benefits outweigh the risks. AFTER THE TRANSFUSION  Right after receiving a blood transfusion, you will usually feel much better and more energetic. This is especially true if your red blood cells have gotten low (anemic). The transfusion raises the level of the red blood cells which carry oxygen, and this usually causes an energy increase.  The nurse administering the transfusion will monitor you carefully for complications. HOME CARE INSTRUCTIONS  No special instructions are needed after a transfusion. You may find your energy is better. Speak with your caregiver about any limitations on activity for underlying diseases you may have. SEEK MEDICAL CARE IF:   Your condition is not improving after your transfusion.  You develop redness or irritation at the intravenous (IV) site. SEEK IMMEDIATE MEDICAL CARE IF:  Any of the following symptoms occur over the next 12 hours:  Shaking chills.  You have a temperature by mouth above 102 F (38.9 C), not controlled by medicine.  Chest, back, or muscle pain.  People around you feel you are not acting correctly or are confused.  Shortness of breath or difficulty breathing.  Dizziness and fainting.  You  get a rash or develop hives.  You have a decrease in urine output.  Your urine turns a dark color or changes to pink, red, or brown. Any of the following symptoms occur over the next 10 days:  You have a temperature by mouth above 102 F (38.9 C), not controlled by medicine.  Shortness of breath.  Weakness after normal activity.  The white part of the eye turns yellow (jaundice).  You have a decrease in the amount of urine or are urinating less often.  Your urine turns a dark color or changes to pink, red, or brown. Document Released: 06/06/2000 Document Revised: 09/01/2011 Document Reviewed: 01/24/2008 [facility] Patient Information 2014 [place], [place].  _______________________________________________________________________

## 2015-04-17 NOTE — Progress Notes (Signed)
Medical clearnce note dr Tera Helperboggs on chart for 04-30-15 surgery ekg 01-29-15 chat[facility] primary care on chart

## 2015-04-18 ENCOUNTER — Encounter (HOSPITAL_COMMUNITY): Payer: Self-pay

## 2015-04-18 ENCOUNTER — Encounter (HOSPITAL_COMMUNITY)
Admission: RE | Admit: 2015-04-18 | Discharge: 2015-04-18 | Disposition: A | Discharge status: Home / Self Care | Payer: BLUE CROSS/BLUE SHIELD | Source: Ambulatory Visit | Attending: Orthopedic Surgery | Admitting: Orthopedic Surgery

## 2015-04-18 DIAGNOSIS — M179 Osteoarthritis of knee, unspecified: Secondary | ICD-10-CM | POA: Insufficient documentation

## 2015-04-18 DIAGNOSIS — Z01818 Encounter for other preprocedural examination: Secondary | ICD-10-CM | POA: Insufficient documentation

## 2015-04-18 LAB — COMPREHENSIVE METABOLIC PANEL
ALBUMIN: 4.4 g/dL (ref 3.5–5.0)
ALT: 45 U/L (ref 17–63)
AST: 29 U/L (ref 15–41)
Alkaline Phosphatase: 41 U/L (ref 38–126)
Anion gap: 7 (ref 5–15)
BUN: 19 mg/dL (ref 6–20)
CALCIUM: 8.9 mg/dL (ref 8.9–10.3)
CO2: 23 mmol/L (ref 22–32)
CREATININE: 1.01 mg/dL (ref 0.61–1.24)
Chloride: 109 mmol/L (ref 101–111)
GFR calc non Af Amer: >60 mL/min (ref >60)
Glucose, Bld: 105 mg/dL — ABNORMAL HIGH (ref 65–99)
Potassium: 4.4 mmol/L (ref 3.5–5.1)
SODIUM: 139 mmol/L (ref 135–145)
Total Bilirubin: 0.7 mg/dL (ref 0.3–1.2)
Total Protein: 7.5 g/dL (ref 6.5–8.1)

## 2015-04-18 LAB — URINALYSIS, ROUTINE W REFLEX MICROSCOPIC
Bilirubin Urine: NEGATIVE
Glucose, UA: NEGATIVE mg/dL
Hgb urine dipstick: NEGATIVE
Ketones, ur: NEGATIVE mg/dL
LEUKOCYTES UA: NEGATIVE
NITRITE: NEGATIVE
PH: 5 (ref 5.0–8.0)
Protein, ur: NEGATIVE mg/dL
SPECIFIC GRAVITY, URINE: 1.019 (ref 1.005–1.030)
Urobilinogen, UA: 0.2 mg/dL (ref 0.0–1.0)

## 2015-04-18 LAB — CBC
HCT: 41.7 % (ref 39.0–52.0)
Hemoglobin: 14.3 g/dL (ref 13.0–17.0)
MCH: 32 pg (ref 26.0–34.0)
MCHC: 34.3 g/dL (ref 30.0–36.0)
MCV: 93.3 fL (ref 78.0–100.0)
PLATELETS: 203 10*3/uL (ref 150–400)
RBC: 4.47 MIL/uL (ref 4.22–5.81)
RDW: 13.1 % (ref 11.5–15.5)
WBC: 5 10*3/uL (ref 4.0–10.5)

## 2015-04-18 LAB — PROTIME-INR
INR: 1.06 (ref 0.00–1.49)
PROTHROMBIN TIME: 14 s (ref 11.6–15.2)

## 2015-04-18 LAB — TYPE AND SCREEN
ABO/RH(D): A POS
Antibody Screen: NEGATIVE

## 2015-04-18 LAB — ABO/RH: ABO/RH(D): A POS

## 2015-04-18 LAB — APTT: aPTT: 29 seconds (ref 24–37)

## 2015-04-18 LAB — SURGICAL PCR SCREEN
MRSA, PCR: NEGATIVE
STAPHYLOCOCCUS AUREUS: NEGATIVE

## 2015-04-26 NOTE — H&P (Signed)
TOTAL KNEE ADMISSION H&P  Patient is being admitted for left unicompartmental knee arthroplasty.  Subjective:  Chief Complaint:left knee pain.  HPI: Reginald Butler, 49 y.o. male, has a history of pain and functional disability in the left knee due to arthritis and has failed non-surgical conservative treatments for greater than 12 weeks to includeNSAID's and/or analgesics, corticosteriod injections, viscosupplementation injections and activity modification.  Onset of symptoms was gradual, starting >10 years ago with gradually worsening course since that time. The patient noted prior procedures on the knee to include  arthroscopy and menisectomy on the left knee(s).  Patient currently rates pain in the left knee(s) at 8 out of 10 with activity. Patient has night pain, worsening of pain with activity and weight bearing, pain that interferes with activities of daily living, pain with passive range of motion, crepitus and joint swelling.  Patient has evidence of periarticular osteophytes and joint space narrowing by imaging studies.  There is no active infection.  Patient Active Problem List   Diagnosis Date Noted  . Acute medial meniscal tear 05/31/2014   Past Medical History  Diagnosis Date  . Complication of anesthesia     MAY BE SLOW TO WAKE UP  . Sleep apnea     USES C PAP  . Acute meniscal tear of knee     LEFT  . Arthritis   . GERD (gastroesophageal reflux disease)   . History of kidney stones     Past Surgical History  Procedure Laterality Date  . Foot surgery  1989    LEFT  . Knee arthroscopy  1999    LEFT  . Cataracts removed Bilateral   . Knee arthroscopy Left 05/31/2014    Procedure: LEFT ARTHROSCOPY KNEE WITH MEDIAL MENISCAL  DEBRIDMENT, CHONDROPLASTY;  Surgeon: Loanne Drilling, MD;  Location: [facility];  Service: Orthopedics;  Laterality: Left;  . Appendectomy  2006     Current outpatient prescriptions:  .  B Complex-C (SUPER B COMPLEX PO), Take 1 tablet by mouth  every morning., Disp: , Rfl:  .  celecoxib (CELEBREX) 200 MG capsule, Take 200 mg by mouth daily., Disp: , Rfl:  .  CINNAMON PO, Take 2 tablets by mouth 2 (two) times daily., Disp: , Rfl:  .  CRANBERRY PO, Take 3,600 mg by mouth every morning., Disp: , Rfl:  .  fexofenadine (ALLEGRA) 180 MG tablet, Take 180 mg by mouth every morning., Disp: , Rfl:  .  Flaxseed, Linseed, (FLAXSEED OIL MAX STR) 1300 MG CAPS, Take 2 capsules by mouth 2 (two) times daily. 1300 mg, Disp: , Rfl:  .  omeprazole (PRILOSEC) 20 MG capsule, Take 20 mg by mouth every morning., Disp: , Rfl:  .  Probiotic Product (PROBIOTIC DAILY PO), Take 1 tablet by mouth every morning., Disp: , Rfl:  .  Psyllium (VEGETABLE LAXATIVE PO), Take 2-3 tablets by mouth 2 (two) times daily. 3 in the morning - 2 at night., Disp: , Rfl:  .  Red Yeast Rice Extract (RED YEAST RICE PO), Take 2 tablets by mouth 2 (two) times daily. 1400 mg, Disp: , Rfl:  .  Saw Palmetto, Serenoa repens, (SAW PALMETTO PO), Take 1 tablet by mouth every morning., Disp: , Rfl:   Allergies  Allergen Reactions  . Milk-Related Compounds Other (See Comments)    Throat irritation; constant need to clear throat  . Other Other (See Comments)    Heavy Pain Medicine--Makes light headed.     Social History  Substance Use Topics  .  Smoking status: Never Smoker   . Smokeless tobacco: Never Used  . Alcohol Use: No      Review of Systems  Constitutional: Negative.   HENT: Negative.   Eyes: Negative.   Respiratory: Negative.   Cardiovascular: Positive for orthopnea. Negative for chest pain, palpitations, claudication, leg swelling and PND.  Gastrointestinal: Positive for heartburn. Negative for nausea, vomiting, abdominal pain, diarrhea, constipation, blood in stool and melena.  Genitourinary: Negative.   Musculoskeletal: Positive for joint pain. Negative for myalgias, back pain, falls and neck pain.       Left knee pain  Skin: Negative.   Neurological: Positive for  dizziness. Negative for tingling, tremors, sensory change, speech change, focal weakness, seizures and loss of consciousness.  Endo/Heme/Allergies: Negative.   Psychiatric/Behavioral: Negative.     Objective:  Physical Exam  Constitutional: He is oriented to person, place, and time. He appears well-developed. No distress.  Obese  HENT:  Head: Normocephalic and atraumatic.  Right Ear: External ear normal.  Left Ear: External ear normal.  Nose: Nose normal.  Mouth/Throat: Oropharynx is clear and moist.  Eyes: Conjunctivae and EOM are normal.  Neck: Normal range of motion. Neck supple.  Cardiovascular: Normal rate, regular rhythm, normal heart sounds and intact distal pulses.   No murmur heard. Respiratory: Effort normal and breath sounds normal. No respiratory distress. He has no wheezes.  GI: Soft. Bowel sounds are normal. He exhibits no distension. There is no tenderness.  Musculoskeletal:       Right hip: Normal.       Left hip: Normal.       Right knee: Normal.       Left knee: He exhibits swelling. He exhibits normal range of motion, no effusion and no erythema. Tenderness found. Medial joint line tenderness noted. No lateral joint line tenderness noted.  His left hip shows normal range of motion, no discomfort. His left knee shows no effusion. Range of motion of the left knee is 0 to 135 degrees. He has slight crepitus with range of motion. He is very tender medially. There is no lateral tenderness or instability noted.  Neurological: He is alert and oriented to person, place, and time. He has normal strength and normal reflexes. No sensory deficit.  Skin: No rash noted. He is not diaphoretic. No erythema.  Psychiatric: He has a normal mood and affect. His behavior is normal.    Vitals  Weight: 260 lb Height: 71in Body Surface Area: 2.36 m Body Mass Index: 36.26 kg/m  Pulse: 72 (Regular)  BP: 110/62 (Sitting, Left Arm, Standard)  Imaging Review Plain radiographs  demonstrate severe degenerative joint disease of the medial compartment of the left knee(s). The overall alignment ismild varus. The bone quality appears to be good for age and reported activity level.  Assessment/Plan:  End stage primary osteoarthritis, left knee   The patient history, physical examination, clinical judgment of the provider and imaging studies are consistent with end stage degenerative joint disease of the left knee(s) and unicompartmental knee arthroplasty is deemed medically necessary. The treatment options including medical management, injection therapy arthroscopy and arthroplasty were discussed at length. The risks and benefits of unicompartmental knee arthroplasty were presented and reviewed. The risks due to aseptic loosening, infection, stiffness, patella tracking problems, thromboembolic complications and other imponderables were discussed. The patient acknowledged the explanation, agreed to proceed with the plan and consent was signed. Patient is being admitted for inpatient treatment for surgery, pain control, PT, OT, prophylactic antibiotics, VTE prophylaxis, progressive  ambulation and ADL's and discharge planning. The patient is planning to be discharged home with home health services   PCP: Dr. Ramiro HarvestWanda Boggs  Prefers as little pain medications as possible because of issues with constipation in the past   [place]ottmber Jerrin Recore, [place] JerseyPA-C

## 2015-04-30 ENCOUNTER — Encounter (HOSPITAL_COMMUNITY): Payer: Self-pay | Admitting: *Deleted

## 2015-04-30 ENCOUNTER — Ambulatory Visit (HOSPITAL_COMMUNITY): Payer: BLUE CROSS/BLUE SHIELD | Admitting: Anesthesiology

## 2015-04-30 ENCOUNTER — Encounter (HOSPITAL_COMMUNITY)
Admission: RE | Disposition: A | Discharge status: Home / Self Care | Payer: Self-pay | Source: Ambulatory Visit | Attending: Orthopedic Surgery

## 2015-04-30 ENCOUNTER — Observation Stay (HOSPITAL_COMMUNITY)
Admission: RE | Admit: 2015-04-30 | Discharge: 2015-05-01 | Disposition: A | Discharge status: Home / Self Care | Payer: BLUE CROSS/BLUE SHIELD | Source: Ambulatory Visit | Attending: Orthopedic Surgery | Admitting: Orthopedic Surgery

## 2015-04-30 DIAGNOSIS — Z9842 Cataract extraction status, left eye: Secondary | ICD-10-CM | POA: Insufficient documentation

## 2015-04-30 DIAGNOSIS — M1712 Unilateral primary osteoarthritis, left knee: Secondary | ICD-10-CM | POA: Diagnosis not present

## 2015-04-30 DIAGNOSIS — M25562 Pain in left knee: Secondary | ICD-10-CM | POA: Diagnosis present

## 2015-04-30 DIAGNOSIS — Z9841 Cataract extraction status, right eye: Secondary | ICD-10-CM | POA: Diagnosis not present

## 2015-04-30 DIAGNOSIS — Z6836 Body mass index (BMI) 36.0-36.9, adult: Secondary | ICD-10-CM | POA: Insufficient documentation

## 2015-04-30 DIAGNOSIS — M1710 Unilateral primary osteoarthritis, unspecified knee: Secondary | ICD-10-CM | POA: Diagnosis present

## 2015-04-30 DIAGNOSIS — G4730 Sleep apnea, unspecified: Secondary | ICD-10-CM | POA: Diagnosis not present

## 2015-04-30 DIAGNOSIS — M25762 Osteophyte, left knee: Secondary | ICD-10-CM | POA: Insufficient documentation

## 2015-04-30 DIAGNOSIS — K219 Gastro-esophageal reflux disease without esophagitis: Secondary | ICD-10-CM | POA: Diagnosis not present

## 2015-04-30 DIAGNOSIS — M179 Osteoarthritis of knee, unspecified: Secondary | ICD-10-CM | POA: Diagnosis present

## 2015-04-30 HISTORY — PX: PARTIAL KNEE ARTHROPLASTY: SHX2174

## 2015-04-30 SURGERY — ARTHROPLASTY, KNEE, UNICOMPARTMENTAL
Anesthesia: Spinal | Site: Knee | Laterality: Left

## 2015-04-30 MED ORDER — TRAMADOL HCL 50 MG PO TABS: 50.0000 mg | ORAL_TABLET | Freq: Four times a day (QID) | ORAL | Status: DC | PRN

## 2015-04-30 MED ORDER — BISACODYL 10 MG RE SUPP: 10.0000 mg | Freq: Every day | RECTAL | Status: DC | PRN

## 2015-04-30 MED ORDER — ONDANSETRON HCL 4 MG/2ML IJ SOLN
INTRAMUSCULAR | Status: DC | PRN
  Administered 2015-04-30: 4 mg via INTRAVENOUS

## 2015-04-30 MED ORDER — ACETAMINOPHEN 10 MG/ML IV SOLN
INTRAVENOUS | Status: AC
  Filled 2015-04-30: qty 100

## 2015-04-30 MED ORDER — ACETAMINOPHEN 500 MG PO TABS
1000.0000 mg | ORAL_TABLET | Freq: Four times a day (QID) | ORAL | Status: DC
  Administered 2015-04-30 – 2015-05-01 (×3): 1000 mg via ORAL
  Filled 2015-04-30 (×5): qty 2

## 2015-04-30 MED ORDER — DEXAMETHASONE SODIUM PHOSPHATE 10 MG/ML IJ SOLN
10.0000 mg | Freq: Once | INTRAMUSCULAR | Status: AC
  Administered 2015-05-01: 10 mg via INTRAVENOUS
  Filled 2015-04-30: qty 1

## 2015-04-30 MED ORDER — 0.9 % SODIUM CHLORIDE (POUR BTL) OPTIME
TOPICAL | Status: DC | PRN
  Administered 2015-04-30: 1000 mL

## 2015-04-30 MED ORDER — SODIUM CHLORIDE 0.9 % IV SOLN
1000.0000 mg | INTRAVENOUS | Status: AC
  Administered 2015-04-30: 1000 mg via INTRAVENOUS
  Filled 2015-04-30: qty 10

## 2015-04-30 MED ORDER — PANTOPRAZOLE SODIUM 40 MG PO TBEC
40.0000 mg | DELAYED_RELEASE_TABLET | Freq: Every day | ORAL | Status: DC
  Filled 2015-04-30: qty 1

## 2015-04-30 MED ORDER — FENTANYL CITRATE (PF) 100 MCG/2ML IJ SOLN
INTRAMUSCULAR | Status: AC
  Filled 2015-04-30: qty 4

## 2015-04-30 MED ORDER — FLEET ENEMA 7-19 GM/118ML RE ENEM: 1.0000 | ENEMA | Freq: Once | RECTAL | Status: DC | PRN

## 2015-04-30 MED ORDER — LACTATED RINGERS IV SOLN
INTRAVENOUS | Status: DC
  Administered 2015-04-30: 15:00:00 via INTRAVENOUS
  Administered 2015-04-30: 1000 mL via INTRAVENOUS

## 2015-04-30 MED ORDER — EPHEDRINE SULFATE 50 MG/ML IJ SOLN
INTRAMUSCULAR | Status: DC | PRN
  Administered 2015-04-30: 7.5 mg via INTRAVENOUS
  Administered 2015-04-30: 5 mg via INTRAVENOUS

## 2015-04-30 MED ORDER — DEXTROSE 5 % IV SOLN
500.0000 mg | Freq: Four times a day (QID) | INTRAVENOUS | Status: DC | PRN
  Administered 2015-04-30: 500 mg via INTRAVENOUS
  Filled 2015-04-30 (×2): qty 5

## 2015-04-30 MED ORDER — PROPOFOL 500 MG/50ML IV EMUL
INTRAVENOUS | Status: DC | PRN
  Administered 2015-04-30: 100 ug/kg/min via INTRAVENOUS

## 2015-04-30 MED ORDER — KETOROLAC TROMETHAMINE 15 MG/ML IJ SOLN
7.5000 mg | Freq: Four times a day (QID) | INTRAMUSCULAR | Status: AC | PRN
  Administered 2015-04-30: 7.5 mg via INTRAVENOUS
  Filled 2015-04-30: qty 1

## 2015-04-30 MED ORDER — PROPOFOL 10 MG/ML IV BOLUS
INTRAVENOUS | Status: AC
  Filled 2015-04-30: qty 20

## 2015-04-30 MED ORDER — SODIUM CHLORIDE 0.9 % IV SOLN
INTRAVENOUS | Status: DC
  Administered 2015-04-30 – 2015-05-01 (×2): via INTRAVENOUS

## 2015-04-30 MED ORDER — CEFAZOLIN SODIUM-DEXTROSE 2-3 GM-% IV SOLR
INTRAVENOUS | Status: AC
  Filled 2015-04-30: qty 50

## 2015-04-30 MED ORDER — HYDROMORPHONE HCL 1 MG/ML IJ SOLN: 0.2500 mg | INTRAMUSCULAR | Status: DC | PRN

## 2015-04-30 MED ORDER — CEFAZOLIN SODIUM-DEXTROSE 2-3 GM-% IV SOLR
2.0000 g | INTRAVENOUS | Status: AC
  Administered 2015-04-30: 2 g via INTRAVENOUS

## 2015-04-30 MED ORDER — SODIUM CHLORIDE 0.9 % IJ SOLN
INTRAMUSCULAR | Status: DC | PRN
  Administered 2015-04-30: 30 mL via INTRAVENOUS

## 2015-04-30 MED ORDER — RIVAROXABAN 10 MG PO TABS
10.0000 mg | ORAL_TABLET | Freq: Every day | ORAL | Status: DC
  Administered 2015-05-01: 10 mg via ORAL
  Filled 2015-04-30 (×2): qty 1

## 2015-04-30 MED ORDER — MENTHOL 3 MG MT LOZG: 1.0000 | LOZENGE | OROMUCOSAL | Status: DC | PRN

## 2015-04-30 MED ORDER — METHOCARBAMOL 500 MG PO TABS
500.0000 mg | ORAL_TABLET | Freq: Four times a day (QID) | ORAL | Status: DC | PRN
  Administered 2015-05-01 (×2): 500 mg via ORAL
  Filled 2015-04-30 (×2): qty 1

## 2015-04-30 MED ORDER — ONDANSETRON HCL 4 MG PO TABS: 4.0000 mg | ORAL_TABLET | Freq: Four times a day (QID) | ORAL | Status: DC | PRN

## 2015-04-30 MED ORDER — CEFAZOLIN SODIUM-DEXTROSE 2-3 GM-% IV SOLR
2.0000 g | Freq: Four times a day (QID) | INTRAVENOUS | Status: AC
  Administered 2015-04-30 – 2015-05-01 (×2): 2 g via INTRAVENOUS
  Filled 2015-04-30 (×2): qty 50

## 2015-04-30 MED ORDER — BUPIVACAINE IN DEXTROSE 0.75-8.25 % IT SOLN
INTRATHECAL | Status: DC | PRN
  Administered 2015-04-30: 2 mL via INTRATHECAL

## 2015-04-30 MED ORDER — PROMETHAZINE HCL 25 MG/ML IJ SOLN: 6.2500 mg | INTRAMUSCULAR | Status: DC | PRN

## 2015-04-30 MED ORDER — DIPHENHYDRAMINE HCL 12.5 MG/5ML PO ELIX: 12.5000 mg | ORAL_SOLUTION | ORAL | Status: DC | PRN

## 2015-04-30 MED ORDER — ONDANSETRON HCL 4 MG/2ML IJ SOLN: 4.0000 mg | Freq: Four times a day (QID) | INTRAMUSCULAR | Status: DC | PRN

## 2015-04-30 MED ORDER — ONDANSETRON HCL 4 MG/2ML IJ SOLN
INTRAMUSCULAR | Status: AC
  Filled 2015-04-30: qty 2

## 2015-04-30 MED ORDER — BUPIVACAINE LIPOSOME 1.3 % IJ SUSP
20.0000 mL | Freq: Once | INTRAMUSCULAR | Status: DC
  Filled 2015-04-30: qty 20

## 2015-04-30 MED ORDER — BUPIVACAINE LIPOSOME 1.3 % IJ SUSP
INTRAMUSCULAR | Status: DC | PRN
  Administered 2015-04-30: 20 mL

## 2015-04-30 MED ORDER — OXYCODONE HCL 5 MG PO TABS
5.0000 mg | ORAL_TABLET | ORAL | Status: DC | PRN
  Administered 2015-04-30 – 2015-05-01 (×7): 10 mg via ORAL
  Filled 2015-04-30 (×7): qty 2

## 2015-04-30 MED ORDER — LIDOCAINE HCL (CARDIAC) 20 MG/ML IV SOLN
INTRAVENOUS | Status: AC
  Filled 2015-04-30: qty 5

## 2015-04-30 MED ORDER — BUPIVACAINE HCL (PF) 0.25 % IJ SOLN
INTRAMUSCULAR | Status: AC
  Filled 2015-04-30: qty 30

## 2015-04-30 MED ORDER — ACETAMINOPHEN 650 MG RE SUPP: 650.0000 mg | Freq: Four times a day (QID) | RECTAL | Status: DC | PRN

## 2015-04-30 MED ORDER — METOCLOPRAMIDE HCL 5 MG/ML IJ SOLN: 5.0000 mg | Freq: Three times a day (TID) | INTRAMUSCULAR | Status: DC | PRN

## 2015-04-30 MED ORDER — SODIUM CHLORIDE 0.9 % IV SOLN: INTRAVENOUS | Status: DC

## 2015-04-30 MED ORDER — DOCUSATE SODIUM 100 MG PO CAPS
100.0000 mg | ORAL_CAPSULE | Freq: Two times a day (BID) | ORAL | Status: DC
  Administered 2015-04-30 – 2015-05-01 (×2): 100 mg via ORAL

## 2015-04-30 MED ORDER — MEPERIDINE HCL 50 MG/ML IJ SOLN: 6.2500 mg | INTRAMUSCULAR | Status: DC | PRN

## 2015-04-30 MED ORDER — LIDOCAINE HCL (CARDIAC) 20 MG/ML IV SOLN
INTRAVENOUS | Status: DC | PRN
  Administered 2015-04-30: 50 mg via INTRAVENOUS

## 2015-04-30 MED ORDER — DEXAMETHASONE SODIUM PHOSPHATE 10 MG/ML IJ SOLN
INTRAMUSCULAR | Status: AC
  Filled 2015-04-30: qty 1

## 2015-04-30 MED ORDER — FENTANYL CITRATE (PF) 100 MCG/2ML IJ SOLN
INTRAMUSCULAR | Status: DC | PRN
  Administered 2015-04-30: 25 ug via INTRAVENOUS
  Administered 2015-04-30: 50 ug via INTRAVENOUS
  Administered 2015-04-30: 25 ug via INTRAVENOUS

## 2015-04-30 MED ORDER — MORPHINE SULFATE (PF) 2 MG/ML IV SOLN
1.0000 mg | INTRAVENOUS | Status: DC | PRN
  Administered 2015-04-30 (×3): 1 mg via INTRAVENOUS
  Filled 2015-04-30 (×3): qty 1

## 2015-04-30 MED ORDER — ACETAMINOPHEN 10 MG/ML IV SOLN
1000.0000 mg | Freq: Once | INTRAVENOUS | Status: AC
  Administered 2015-04-30: 1000 mg via INTRAVENOUS
  Filled 2015-04-30: qty 100

## 2015-04-30 MED ORDER — METOCLOPRAMIDE HCL 10 MG PO TABS: 5.0000 mg | ORAL_TABLET | Freq: Three times a day (TID) | ORAL | Status: DC | PRN

## 2015-04-30 MED ORDER — ACETAMINOPHEN 325 MG PO TABS: 650.0000 mg | ORAL_TABLET | Freq: Four times a day (QID) | ORAL | Status: DC | PRN

## 2015-04-30 MED ORDER — MIDAZOLAM HCL 5 MG/5ML IJ SOLN
INTRAMUSCULAR | Status: DC | PRN
  Administered 2015-04-30: 2 mg via INTRAVENOUS

## 2015-04-30 MED ORDER — MIDAZOLAM HCL 2 MG/2ML IJ SOLN
INTRAMUSCULAR | Status: AC
  Filled 2015-04-30: qty 4

## 2015-04-30 MED ORDER — MIDAZOLAM HCL 2 MG/2ML IJ SOLN: 0.5000 mg | Freq: Once | INTRAMUSCULAR | Status: DC | PRN

## 2015-04-30 MED ORDER — SODIUM CHLORIDE 0.9 % IV SOLN: INTRAVENOUS | Status: DC | PRN

## 2015-04-30 MED ORDER — POLYETHYLENE GLYCOL 3350 17 G PO PACK: 17.0000 g | PACK | Freq: Every day | ORAL | Status: DC | PRN

## 2015-04-30 MED ORDER — BUPIVACAINE HCL (PF) 0.25 % IJ SOLN
INTRAMUSCULAR | Status: DC | PRN
  Administered 2015-04-30: 30 mL

## 2015-04-30 MED ORDER — PHENOL 1.4 % MT LIQD: 1.0000 | OROMUCOSAL | Status: DC | PRN

## 2015-04-30 MED ORDER — SODIUM CHLORIDE 0.9 % IR SOLN
Status: DC | PRN
  Administered 2015-04-30: 1000 mL

## 2015-04-30 MED ORDER — LORATADINE 10 MG PO TABS
10.0000 mg | ORAL_TABLET | Freq: Every day | ORAL | Status: DC
  Administered 2015-05-01: 10 mg via ORAL
  Filled 2015-04-30: qty 1

## 2015-04-30 MED ORDER — DEXAMETHASONE SODIUM PHOSPHATE 10 MG/ML IJ SOLN
10.0000 mg | Freq: Once | INTRAMUSCULAR | Status: AC
  Administered 2015-04-30: 10 mg via INTRAVENOUS

## 2015-04-30 MED ORDER — SODIUM CHLORIDE 0.9 % IJ SOLN
INTRAMUSCULAR | Status: AC
  Filled 2015-04-30: qty 50

## 2015-04-30 SURGICAL SUPPLY — 44 items
BAG DECANTER FOR FLEXI CONT (MISCELLANEOUS) ×3 IMPLANT
BAG SPEC THK2 15X12 ZIP CLS (MISCELLANEOUS)
BAG ZIPLOCK 12X15 (MISCELLANEOUS) IMPLANT
BANDAGE ELASTIC 6 VELCRO ST LF (GAUZE/BANDAGES/DRESSINGS) ×3 IMPLANT
BLADE SAW RECIPROCATING 77.5 (BLADE) ×3 IMPLANT
BLADE SAW SGTL 13.0X1.19X90.0M (BLADE) ×3 IMPLANT
BOWL SMART MIX CTS (DISPOSABLE) ×3 IMPLANT
BUR OVAL CARBIDE 4.0MM ×2 IMPLANT
CAPT KNEE PARTIAL 2 ×3 IMPLANT
CEMENT HV SMART SET (Cement) ×3 IMPLANT
CLOSURE WOUND 1/2 X4 (GAUZE/BANDAGES/DRESSINGS) ×1
CLOTH BEACON ORANGE TIMEOUT ST (SAFETY) ×3 IMPLANT
CUFF TOURN SGL QUICK 34 (TOURNIQUET CUFF) ×3
CUFF TRNQT CYL 34X4X40X1 (TOURNIQUET CUFF) ×1 IMPLANT
DRSG ADAPTIC 3X8 NADH LF (GAUZE/BANDAGES/DRESSINGS) ×3 IMPLANT
DRSG PAD ABDOMINAL 8X10 ST (GAUZE/BANDAGES/DRESSINGS) ×3 IMPLANT
DURAPREP 26ML APPLICATOR (WOUND CARE) ×3 IMPLANT
ELECT REM PT RETURN 9FT ADLT (ELECTROSURGICAL) ×3
ELECTRODE REM PT RTRN 9FT ADLT (ELECTROSURGICAL) ×1 IMPLANT
EVACUATOR 1/8 PVC DRAIN (DRAIN) ×3 IMPLANT
GAUZE SPONGE 4X4 12PLY STRL (GAUZE/BANDAGES/DRESSINGS) ×3 IMPLANT
GLOVE BIO SURGEON STRL SZ7.5 (GLOVE) ×3 IMPLANT
GLOVE BIO SURGEON STRL SZ8 (GLOVE) ×3 IMPLANT
GLOVE BIOGEL PI IND STRL 8 (GLOVE) ×2 IMPLANT
GLOVE BIOGEL PI INDICATOR 8 (GLOVE) ×4
GOWN STRL REUS W/TWL LRG LVL3 (GOWN DISPOSABLE) ×3 IMPLANT
GOWN STRL REUS W/TWL XL LVL3 (GOWN DISPOSABLE) ×3 IMPLANT
HANDPIECE INTERPULSE COAX TIP (DISPOSABLE) ×3
IMMOBILIZER KNEE 20 (SOFTGOODS) ×2 IMPLANT
KIT IMPL STRL TIB IPOLY IUNI IMPLANT
MANIFOLD NEPTUNE II (INSTRUMENTS) ×3 IMPLANT
PACK TOTAL KNEE CUSTOM (KITS) ×3 IMPLANT
PADDING CAST COTTON 6X4 STRL (CAST SUPPLIES) ×4 IMPLANT
POSITIONER SURGICAL ARM (MISCELLANEOUS) ×3 IMPLANT
SET HNDPC FAN SPRY TIP SCT (DISPOSABLE) ×1 IMPLANT
STRIP CLOSURE SKIN 1/2X4 (GAUZE/BANDAGES/DRESSINGS) ×3 IMPLANT
SUT MNCRL AB 4-0 PS2 18 (SUTURE) ×3 IMPLANT
SUT VIC AB 2-0 CT1 27 (SUTURE) ×6
SUT VIC AB 2-0 CT1 TAPERPNT 27 (SUTURE) ×2 IMPLANT
SUT VLOC 180 0 24IN GS25 (SUTURE) ×3 IMPLANT
SYR 50ML LL SCALE MARK (SYRINGE) ×3 IMPLANT
TUBING CONNECTING 10 (TUBING) ×1 IMPLANT
TUBING CONNECTING 10' (TUBING) ×1
YANKAUER SUCT BULB TIP NO VENT (SUCTIONS) ×2 IMPLANT

## 2015-04-30 NOTE — Anesthesia Preprocedure Evaluation (Addendum)
Anesthesia Evaluation  Patient identified by MRN, date of birth, ID band Patient awake    Reviewed: Allergy & Precautions, NPO status , Patient's Chart, lab work & pertinent test results  History of Anesthesia Complications Negative for: history of anesthetic complications  Airway Mallampati: II  TM Distance: >3 FB Neck ROM: Full    Dental  (+) Dental Advisory Given   Pulmonary sleep apnea and Continuous Positive Airway Pressure Ventilation ,    breath sounds clear to auscultation       Cardiovascular (-) anginanegative cardio ROS   Rhythm:Regular Rate:Normal     Neuro/Psych negative neurological ROS     GI/Hepatic Neg liver ROS, GERD  Medicated and Controlled,  Endo/Other  Morbid obesity  Renal/GU negative Renal ROS     Musculoskeletal  (+) Arthritis , Osteoarthritis,    Abdominal (+) + obese,   Peds  Hematology negative hematology ROS (+)   Anesthesia Other Findings   Reproductive/Obstetrics                            Anesthesia Physical Anesthesia Plan  ASA: III  Anesthesia Plan: Spinal   Post-op Pain Management:    Induction:   Airway Management Planned: Natural Airway and Simple Face Mask  Additional Equipment:   Intra-op Plan:   Post-operative Plan:   Informed Consent: I have reviewed the patients History and Physical, chart, labs and discussed the procedure including the risks, benefits and alternatives for the proposed anesthesia with the patient or authorized representative who has indicated his/her understanding and acceptance.   Dental advisory given  Plan Discussed with: CRNA and Surgeon  Anesthesia Plan Comments: (Plan routine monitors, SAB)        Anesthesia Quick Evaluation

## 2015-04-30 NOTE — Interval H&P Note (Signed)
History and Physical Interval Note:  04/30/2015 1:29 PM  Reginald Butler  has presented today for surgery, with the diagnosis of MEDIAL COMPARTMENT OSTEOARTHRITIS OF LEFT KNEE  The various methods of treatment have been discussed with the patient and family. After consideration of risks, benefits and other options for treatment, the patient has consented to  Procedure(s): LEFT KNEE UNICOMPARTMENTAL ARTHROPLASTY (Left) as a surgical intervention .  The patient's history has been reviewed, patient examined, no change in status, stable for surgery.  I have reviewed the patient's chart and labs.  Questions were answered to the patient's satisfaction.     Loanne DrillingALUISIO,Yaiza Palazzola V

## 2015-04-30 NOTE — Progress Notes (Signed)
Pt placed on CPAP qhs.  Pt using his nasal pillows and tubing from home with hospital machine.  Machine plugged into red outlet and humidifier filled with sterile water.  Machine set on CPAP setting of 9 cm H2O per Pt home settings and 2 Lpm of O2 being bled into circuit.  Pt resting stable and comfortable.  RT will continue to monitor and assess as needed.

## 2015-04-30 NOTE — Op Note (Signed)
OPERATIVE REPORT  PREOPERATIVE DIAGNOSIS: Medial compartment osteoarthritis, Left knee  POSTOPERATIVE DIAGNOSIS: Medial compartment osteoarthritis, Left knee  PROCEDURE:Left knee medial unicompartmental arthroplasty.   SURGEON: Ollen Gross, MD   ASSISTANT: Avel Peace, PA-C  ANESTHESIA:  Spinal.   ESTIMATED BLOOD LOSS: Minimal.   DRAINS: Hemovac x1.   TOURNIQUET TIME: 31 minutes at 300 mmHg.   COMPLICATIONS: None.   CONDITION: Stable to recovery.   BRIEF CLINICAL NOTE:Reginald Butler is a 49 y.o. male, who has  significant isolated medial compartment arthritis of the Left knee. He has had nonoperative management including injections of cortisone. Unfortunately, the pain persists.  Radiograph showed isolated medial compartment bone-on-bone arthritis  with normal-appearing patellofemoral and lateral compartments. He  presents now for left knee unicompartmental arthroplasty.   PROCEDURE IN DETAIL: After successful administration of  Spinal anesthetic, a tourniquet was placed high on the  Left thigh and left lower extremity prepped and draped in usual sterile fashion. Extremity was wrapped in an Esmarch, knee flexed, and tourniquet inflated to 300 mmHg. A midline incision was made with a 10 blade through subcutaneous  tissue to the extensor mechanism. A fresh blade was used to make a  medial parapatellar arthrotomy. Soft tissue on the proximal medial  tibia subperiosteally elevated to the joint line with a knife and into  the semimembranosus bursa with a Cobb elevator. The patella was  subluxed laterally, and the knee flexed 90 degrees. The ACL was intact.  The marginal osteophytes on the medial femur and tibia were removed with  a rongeur. The medial meniscus was also removed. The femoral cutting  block where the conformis unicompartmental knee system was placed along  the femur. There was excellent fit. I traced the outline. We then  removed any  remaining cartilage within this outline. We then placed the  cutting block again and pinned in position. The posterior femoral cut  was made, it was approximately 5 mm. The lug holes for the femoral  component were then drilled through the cutting block. The cutting  block was subsequently removed. We then utilized the high speed burr to  create a small trough at the superior aspect of the components that of  the inset and would not overhang the cartilage. The trial was placed,  it had excellent fit. The trial was subsequently removed.       The trial was placed again and the B chip was placed. There was  excellent balance throughout full motion. Also with excellent fit on  her tibia. This was removed as was the femoral trial. A curette was  used to remove any remaining cartilage from the tibia. The tibial  cutting block was then placed and there was a perfect fit on the tibial  surface. The appropriate slope was placed and it was pinned in  position. The reciprocating saw was used to make the central cut and  then the oscillating saw used to make the horizontal cut. The bone  fragment was then removed. The tibial trial was placed and had perfect  fit on the tibia. We then drilled the 2 lug holes and did the keel punch.  We then placed tibia trial femur, and a 6 mm trial insert. There was  excellent stability throughout full range of motion and no impingement.  The trial was then removed. We drilled small holes in the distal  femur in order to create more conduits for the cement. The cut bone  surfaces were thoroughly irrigated with pulsatile lavage while  the  cement was mixed on the back table. We then cemented the tibial  component into place, impacted it and removed the extruded cement. The  same was done for the femoral component. Trial 6-mm inserts placed,  knee held in full extension, and all extruded cement removed. While the  cement was hardening, I injected the extensor mechanism,  periosteum of  the femur and subcu tissues, a total of 20 mL of Exparel mixed with 30  mL of saline and then did an additional injection of 20 mL of 0.25%  Marcaine into the same tissues. When the cement had fully hardened,  then the permanent polyethylene was placed in tibial tray. There was  excellent stability throughout full range of motion with no lift off the  component and no evidence of any impingement. Wound was copiously  irrigated with saline solution, and the arthrotomy closed over a Hemovac  drain with a running #1 V-Loc suture. The subcutaneous was closed with  interrupted 2-0 Vicryl and subcuticular running 4-0 Monocryl. The drain  was hooked to suction. Incision cleaned and dried and Steri-Strips and  a bulky sterile dressing applied. The tourniquet was released after a  total time of 31 minutes. This was done after closing the extensor  mechanism. The wound was closed and a bulky sterile dressing was  applied. She was placed into a knee immobilizer, awakened and  transported to recovery room in stable condition.  Please note that a surgical assistant was a medical necessity for this  procedure in order to perform it in a safe and expeditious manner.  Assistance was necessary for retracting vital ligaments, neurovascular  structures, as well as for proper positioning of the limb to allow for  appropriate bone cuts and appropriate placement of the prosthesis.    Gus RankinFrank V. Shizuko Wojdyla, MD

## 2015-04-30 NOTE — Transfer of Care (Signed)
Immediate Anesthesia Transfer of Care Note  Patient: Reginald Butler  Procedure(s) Performed: Procedure(s): LEFT KNEE UNICOMPARTMENTAL ARTHROPLASTY (Left)  Patient Location: PACU  Anesthesia Type:Spinal  Level of Consciousness: awake, alert  and oriented  Airway & Oxygen Therapy: Patient Spontanous Breathing and Patient connected to face mask oxygen  Post-op Assessment: Report given to RN and Post -op Vital signs reviewed and stable  Post vital signs: Reviewed and stable  Last Vitals:  Filed Vitals:   04/30/15 1123  BP: 112/66  Pulse: 56  Temp: 36.3 C  Resp: 16    Complications: No apparent anesthesia complications

## 2015-04-30 NOTE — Anesthesia Postprocedure Evaluation (Signed)
  Anesthesia Post-op Note  Patient: Reginald Butler  Procedure(s) Performed: Procedure(s): LEFT KNEE UNICOMPARTMENTAL ARTHROPLASTY (Left)  Patient Location: PACU  Anesthesia Type:Spinal  Level of Consciousness: awake, alert , oriented and patient cooperative  Airway and Oxygen Therapy: Patient Spontanous Breathing and Patient connected to nasal cannula oxygen  Post-op Pain: none  Post-op Assessment: Post-op Vital signs reviewed, Patient's Cardiovascular Status Stable, Respiratory Function Stable, Patent Airway, No signs of Nausea or vomiting and Pain level controlled LLE Motor Response: No movement due to regional block   RLE Motor Response: No movement due to regional block   L Sensory Level: L3-Anterior knee, lower leg R Sensory Level: L3-Anterior knee, lower leg  Post-op Vital Signs: Reviewed and stable  Last Vitals:  Filed Vitals:   04/30/15 1642  BP: 105/63  Pulse: 71  Temp:   Resp: 16    Complications: No apparent anesthesia complications

## 2015-04-30 NOTE — Anesthesia Procedure Notes (Signed)
Spinal Patient location during procedure: OR Start time: 04/30/2015 2:24 PM End time: 04/30/2015 2:29 PM Staffing Resident/CRNA: Chrystine Oiler G Performed by: anesthesiologist  Preanesthetic Checklist Completed: patient identified, site marked, surgical consent, pre-op evaluation, timeout performed, IV checked, risks and benefits discussed and monitors and equipment checked Spinal Block Patient position: sitting Prep: Betadine Patient monitoring: heart rate, continuous pulse ox and blood pressure Approach: midline Location: L3-4 Injection technique: single-shot Needle Needle type: Spinocan and Sprotte  Needle gauge: 24 G Needle length: 10 cm Needle insertion depth: 6 cm Assessment Sensory level: T6 Events: paresthesia Additional Notes Expiration date of kit checked and confirmed.  Brief paresthesia with needle insertion, subsided, no paraesthesia with injection. Patient tolerated procedure well, without complications.

## 2015-05-01 ENCOUNTER — Encounter (HOSPITAL_COMMUNITY): Payer: Self-pay | Admitting: Orthopedic Surgery

## 2015-05-01 DIAGNOSIS — M1712 Unilateral primary osteoarthritis, left knee: Secondary | ICD-10-CM | POA: Diagnosis not present

## 2015-05-01 LAB — CBC
HEMATOCRIT: 37.9 % — AB (ref 39.0–52.0)
Hemoglobin: 13.1 g/dL (ref 13.0–17.0)
MCH: 32.2 pg (ref 26.0–34.0)
MCHC: 34.6 g/dL (ref 30.0–36.0)
MCV: 93.1 fL (ref 78.0–100.0)
PLATELETS: 208 10*3/uL (ref 150–400)
RBC: 4.07 MIL/uL — ABNORMAL LOW (ref 4.22–5.81)
RDW: 12.9 % (ref 11.5–15.5)
WBC: 12.2 10*3/uL — AB (ref 4.0–10.5)

## 2015-05-01 LAB — BASIC METABOLIC PANEL
ANION GAP: 8 (ref 5–15)
BUN: 14 mg/dL (ref 6–20)
CALCIUM: 8.8 mg/dL — AB (ref 8.9–10.3)
CO2: 22 mmol/L (ref 22–32)
CREATININE: 1.16 mg/dL (ref 0.61–1.24)
Chloride: 109 mmol/L (ref 101–111)
Glucose, Bld: 161 mg/dL — ABNORMAL HIGH (ref 65–99)
Potassium: 4.3 mmol/L (ref 3.5–5.1)
SODIUM: 139 mmol/L (ref 135–145)

## 2015-05-01 MED ORDER — OXYCODONE HCL 5 MG PO TABS: 5.0000 mg | ORAL_TABLET | ORAL | Status: AC | PRN

## 2015-05-01 MED ORDER — RIVAROXABAN 10 MG PO TABS: 10.0000 mg | ORAL_TABLET | Freq: Every day | ORAL | Status: AC

## 2015-05-01 MED ORDER — NON FORMULARY: 20.0000 mg | Freq: Every morning | Status: DC

## 2015-05-01 MED ORDER — TRAMADOL HCL 50 MG PO TABS
50.0000 mg | ORAL_TABLET | Freq: Four times a day (QID) | ORAL | Status: AC | PRN
Start: 2015-05-01 — End: ?

## 2015-05-01 MED ORDER — METHOCARBAMOL 500 MG PO TABS: 500.0000 mg | ORAL_TABLET | Freq: Four times a day (QID) | ORAL | Status: AC | PRN

## 2015-05-01 MED ORDER — ZOLPIDEM TARTRATE 10 MG PO TABS
10.0000 mg | ORAL_TABLET | Freq: Every evening | ORAL | Status: DC | PRN
  Administered 2015-05-01: 10 mg via ORAL
  Filled 2015-05-01: qty 1

## 2015-05-01 MED ORDER — OMEPRAZOLE 20 MG PO CPDR
20.0000 mg | DELAYED_RELEASE_CAPSULE | Freq: Every day | ORAL | Status: DC
  Administered 2015-05-01: 20 mg via ORAL
  Filled 2015-05-01: qty 1

## 2015-05-01 NOTE — Discharge Summary (Signed)
Physician Discharge Summary   Patient ID: Reginald Butler MRN: [id number] DOB/AGE: May 09, 1966 49 y.o.  Admit date: 04/30/2015 Discharge date: 05-01-2015  Primary Diagnosis:  Medial compartment osteoarthritis, Left knee  Admission Diagnoses:  Past Medical History  Diagnosis Date  . Complication of anesthesia     MAY BE SLOW TO WAKE UP  . Sleep apnea     USES C PAP  . Acute meniscal tear of knee     LEFT  . Arthritis   . GERD (gastroesophageal reflux disease)   . History of kidney stones    Discharge Diagnoses:   Principal Problem:   OA (osteoarthritis) of knee  Estimated body mass index is 36.28 kg/(m^2) as calculated from the following:   Height as of this encounter: 5' 11"  (1.803 m).   Weight as of this encounter: 117.935 kg (260 lb).  Procedure:  Procedure(s) (LRB): LEFT KNEE UNICOMPARTMENTAL ARTHROPLASTY (Left)   Consults: None  HPI: Reginald Butler is a 49 y.o. male, who has  significant isolated medial compartment arthritis of the Left knee. He has had nonoperative management including injections of cortisone. Unfortunately, the pain persists.  Radiograph showed isolated medial compartment bone-on-bone arthritis  with normal-appearing patellofemoral and lateral compartments. He  presents now for left knee unicompartmental arthroplasty.   Laboratory Data: Admission on 04/30/2015  Component Date Value Ref Range Status  . WBC 05/01/2015 12.2* 4.0 - 10.5 K/uL Final  . RBC 05/01/2015 4.07* 4.22 - 5.81 MIL/uL Final  . Hemoglobin 05/01/2015 13.1  13.0 - 17.0 g/dL Final  . HCT 05/01/2015 37.9* 39.0 - 52.0 % Final  . MCV 05/01/2015 93.1  78.0 - 100.0 fL Final  . MCH 05/01/2015 32.2  26.0 - 34.0 pg Final  . MCHC 05/01/2015 34.6  30.0 - 36.0 g/dL Final  . RDW 05/01/2015 12.9  11.5 - 15.5 % Final  . Platelets 05/01/2015 208  150 - 400 K/uL Final  . Sodium 05/01/2015 139  135 - 145 mmol/L Final  . Potassium 05/01/2015 4.3  3.5 - 5.1 mmol/L Final  . Chloride  05/01/2015 109  101 - 111 mmol/L Final  . CO2 05/01/2015 22  22 - 32 mmol/L Final  . Glucose, Bld 05/01/2015 161* 65 - 99 mg/dL Final  . BUN 05/01/2015 14  6 - 20 mg/dL Final  . Creatinine, Ser 05/01/2015 1.16  0.61 - 1.24 mg/dL Final  . Calcium 05/01/2015 8.8* 8.9 - 10.3 mg/dL Final  . GFR calc non Af Amer 05/01/2015 >60  >60 mL/min Final  . GFR calc Af Amer 05/01/2015 >60  >60 mL/min Final   Comment: (NOTE) The eGFR has been calculated using the CKD EPI equation. This calculation has not been validated in all clinical situations. eGFR's persistently <60 mL/min signify possible Chronic Kidney Disease.   Georgiann Hahn gap 05/01/2015 8  5 - 15 Final  Hospital Outpatient Visit on 04/18/2015  Component Date Value Ref Range Status  . aPTT 04/18/2015 29  24 - 37 seconds Final  . WBC 04/18/2015 5.0  4.0 - 10.5 K/uL Final  . RBC 04/18/2015 4.47  4.22 - 5.81 MIL/uL Final  . Hemoglobin 04/18/2015 14.3  13.0 - 17.0 g/dL Final  . HCT 04/18/2015 41.7  39.0 - 52.0 % Final  . MCV 04/18/2015 93.3  78.0 - 100.0 fL Final  . MCH 04/18/2015 32.0  26.0 - 34.0 pg Final  . MCHC 04/18/2015 34.3  30.0 - 36.0 g/dL Final  . RDW 04/18/2015 13.1  11.5 - 15.5 %  Final  . Platelets 04/18/2015 203  150 - 400 K/uL Final  . Sodium 04/18/2015 139  135 - 145 mmol/L Final  . Potassium 04/18/2015 4.4  3.5 - 5.1 mmol/L Final  . Chloride 04/18/2015 109  101 - 111 mmol/L Final  . CO2 04/18/2015 23  22 - 32 mmol/L Final  . Glucose, Bld 04/18/2015 105* 65 - 99 mg/dL Final  . BUN 04/18/2015 19  6 - 20 mg/dL Final  . Creatinine, Ser 04/18/2015 1.01  0.61 - 1.24 mg/dL Final  . Calcium 04/18/2015 8.9  8.9 - 10.3 mg/dL Final  . Total Protein 04/18/2015 7.5  6.5 - 8.1 g/dL Final  . Albumin 04/18/2015 4.4  3.5 - 5.0 g/dL Final  . AST 04/18/2015 29  15 - 41 U/L Final  . ALT 04/18/2015 45  17 - 63 U/L Final  . Alkaline Phosphatase 04/18/2015 41  38 - 126 U/L Final  . Total Bilirubin 04/18/2015 0.7  0.3 - 1.2 mg/dL Final  . GFR calc  non Af Amer 04/18/2015 >60  >60 mL/min Final  . GFR calc Af Amer 04/18/2015 >60  >60 mL/min Final   Comment: (NOTE) The eGFR has been calculated using the CKD EPI equation. This calculation has not been validated in all clinical situations. eGFR's persistently <60 mL/min signify possible Chronic Kidney Disease.   . Anion gap 04/18/2015 7  5 - 15 Final  . Prothrombin Time 04/18/2015 14.0  11.6 - 15.2 seconds Final  . INR 04/18/2015 1.06  0.00 - 1.49 Final  . ABO/RH(D) 04/18/2015 A POS   Final  . Antibody Screen 04/18/2015 NEG   Final  . Sample Expiration 04/18/2015 05/02/2015   Final  . Extend sample reason 04/18/2015 NO TRANSFUSIONS OR PREGNANCY IN THE PAST 3 MONTHS   Final  . Color, Urine 04/18/2015 YELLOW  YELLOW Final  . APPearance 04/18/2015 CLEAR  CLEAR Final  . Specific Gravity, Urine 04/18/2015 1.019  1.005 - 1.030 Final  . pH 04/18/2015 5.0  5.0 - 8.0 Final  . Glucose, UA 04/18/2015 NEGATIVE  NEGATIVE mg/dL Final  . Hgb urine dipstick 04/18/2015 NEGATIVE  NEGATIVE Final  . Bilirubin Urine 04/18/2015 NEGATIVE  NEGATIVE Final  . Ketones, ur 04/18/2015 NEGATIVE  NEGATIVE mg/dL Final  . Protein, ur 04/18/2015 NEGATIVE  NEGATIVE mg/dL Final  . Urobilinogen, UA 04/18/2015 0.2  0.0 - 1.0 mg/dL Final  . Nitrite 04/18/2015 NEGATIVE  NEGATIVE Final  . Leukocytes, UA 04/18/2015 NEGATIVE  NEGATIVE Final   MICROSCOPIC NOT DONE ON URINES WITH NEGATIVE PROTEIN, BLOOD, LEUKOCYTES, NITRITE, OR GLUCOSE <1000 mg/dL.  Marland Kitchen MRSA, PCR 04/18/2015 NEGATIVE  NEGATIVE Final  . Staphylococcus aureus 04/18/2015 NEGATIVE  NEGATIVE Final   Comment:        The Xpert SA Assay (FDA approved for NASAL specimens in patients over 16 years of age), is one component of a comprehensive surveillance program.  Test performance has been validated by [facility] for patients greater than or equal to 94 year old. It is not intended to diagnose infection nor to guide or monitor treatment.   . ABO/RH(D) 04/18/2015  A POS   Final     X-Rays:No results found.  EKG:No orders found for this or any previous visit.   Hospital Course: Reginald Butler is a 49 y.o. who was admitted to [facility]. They were brought to the operating room on 04/30/2015 and underwent Procedure(s): LEFT KNEE UNICOMPARTMENTAL ARTHROPLASTY.  Patient tolerated the procedure well and was later transferred to the  recovery room and then to the orthopaedic floor for postoperative care.  They were given PO and IV analgesics for pain control following their surgery.  They were given 24 hours of postoperative antibiotics of  Anti-infectives    Start     Dose/Rate Route Frequency Ordered Stop   04/30/15 2200  ceFAZolin (ANCEF) IVPB 2 g/50 mL premix     2 g 100 mL/hr over 30 Minutes Intravenous Every 6 hours 04/30/15 1718 05/01/15 0522   04/30/15 1137  ceFAZolin (ANCEF) IVPB 2 g/50 mL premix     2 g 100 mL/hr over 30 Minutes Intravenous On call to O.R. 04/30/15 1137 04/30/15 1434     and started on DVT prophylaxis in the form of Xarelto.   PT and OT were ordered for postop therapy protocol.  Discharge planning consulted to help with postop disposition and equipment needs.  Patient had a tough night on the evening of surgery due to pain.  They started to get up OOB with therapy on day one after better pain control. Hemovac drain was pulled without difficulty.  Patient was seen in rounds on day one and it was felt that as long as they did well with the remaining sessions of therapy that they would be ready to go home.  Arrangements were made and they were setup to go home on POD 1.  Discharge home with home health Diet - Regular diet Follow up - in 2 weeks Activity - WBAT Dressing - May remove the surgical dressing tomorrow at home and then apply a dry gauze dressing daily. May shower three days following surgery but do not submerge the incision under water. Disposition - Home Condition Upon Discharge - Good D/C Meds - See DC  Summary DVT Prophylaxis Xarelto 10 mg daily for ten days, then change to Aspirin 325 mg daily for two weeks, then reduce to Baby Aspirin 81 mg daily for three additional weeks.  Discharge Instructions    Call MD / Call 911    Complete by:  As directed   If you experience chest pain or shortness of breath, CALL 911 and be transported to the hospital emergency room.  If you develope a fever above 101 F, pus (white drainage) or increased drainage or redness at the wound, or calf pain, call your surgeon's office.     Change dressing    Complete by:  As directed   Change dressing daily with sterile 4 x 4 inch gauze dressing and apply TED hose. Do not submerge the incision under water.     Constipation Prevention    Complete by:  As directed   Drink plenty of fluids.  Prune juice may be helpful.  You may use a stool softener, such as Colace (over the counter) 100 mg twice a day.  Use MiraLax (over the counter) for constipation as needed.     Diet general    Complete by:  As directed      Discharge instructions    Complete by:  As directed   Pick up stool softner and laxative for home use following surgery while on pain medications. Do not submerge incision under water. May remove the surgical dressing tomorrow, Wednesday 05/02/2015, and then apply a dry gauze dressing daily. Please use good hand washing techniques while changing dressing each day. May shower starting three days after surgery starting Thursday 05/03/2015. Please use a clean towel to pat the incision dry following showers. Continue to use ice for pain and swelling after  surgery. Do not use any lotions or creams on the incision until instructed by your surgeon.  Postoperative Constipation Protocol  Constipation - defined medically as fewer than three stools per week and severe constipation as less than one stool per week.  One of the most common issues patients have following surgery is constipation. Even if you have a regular  bowel pattern at home, your normal regimen is likely to be disrupted due to multiple reasons following surgery. Combination of anesthesia, postoperative narcotics, change in appetite and fluid intake all can affect your bowels. In order to avoid complications following surgery, here are some recommendations in order to help you during your recovery period.  Colace (docusate) - Pick up an over-the-counter form of Colace or another stool softener and take twice a day as long as you are requiring postoperative pain medications. Take with a full glass of water daily. If you experience loose stools or diarrhea, hold the colace until you stool forms back up. If your symptoms do not get better within 1 week or if they get worse, check with your doctor.  Dulcolax (bisacodyl) - Pick up over-the-counter and take as directed by the product packaging as needed to assist with the movement of your bowels. Take with a full glass of water. Use this product as needed if not relieved by Colace only.   MiraLax (polyethylene glycol) - Pick up over-the-counter to have on hand. MiraLax is a solution that will increase the amount of water in your bowels to assist with bowel movements. Take as directed and can mix with a glass of water, juice, soda, coffee, or tea. Take if you go more than two days without a movement. Do not use MiraLax more than once per day. Call your doctor if you are still constipated or irregular after using this medication for 7 days in a row.  If you continue to have problems with postoperative constipation, please contact the office for further assistance and recommendations. If you experience "the worst abdominal pain ever" or develop nausea or vomiting, please contact the office immediatly for further recommendations for treatment.  Xarelto 10 mg daily for ten days, then change to Aspirin 325 mg daily for two weeks, then reduce to Baby Aspirin 81 mg daily for three additional weeks.     Do  not put a pillow under the knee. Place it under the heel.    Complete by:  As directed      Do not sit on low chairs, stoools or toilet seats, as it may be difficult to get up from low surfaces    Complete by:  As directed      Driving restrictions    Complete by:  As directed   No driving until released by the physician.     Increase activity slowly as tolerated    Complete by:  As directed      Lifting restrictions    Complete by:  As directed   No lifting until released by the physician.     Patient may shower    Complete by:  As directed   You may shower without a dressing once there is no drainage.  Do not wash over the wound.  If drainage remains, do not shower until drainage stops.     TED hose    Complete by:  As directed   Use stockings (TED hose) for 3 weeks on both leg(s).  You may remove them at night for sleeping.  Weight bearing as tolerated    Complete by:  As directed   Laterality:  left  Extremity:  Lower            Medication List    STOP taking these medications        celecoxib 200 MG capsule  Commonly known as:  CELEBREX     CINNAMON PO     CRANBERRY PO     FLAXSEED OIL MAX STR 1300 MG Caps     PROBIOTIC DAILY PO     RED YEAST RICE PO     SAW PALMETTO PO     SUPER B COMPLEX PO     VEGETABLE LAXATIVE PO      TAKE these medications        fexofenadine 180 MG tablet  Commonly known as:  ALLEGRA  Take 180 mg by mouth every morning.     methocarbamol 500 MG tablet  Commonly known as:  ROBAXIN  Take 1 tablet (500 mg total) by mouth every 6 (six) hours as needed for muscle spasms.     omeprazole 20 MG capsule  Commonly known as:  PRILOSEC  Take 20 mg by mouth every morning.     oxyCODONE 5 MG immediate release tablet  Commonly known as:  Oxy IR/ROXICODONE  Take 1-2 tablets (5-10 mg total) by mouth every 3 (three) hours as needed for moderate pain or severe pain.     rivaroxaban 10 MG Tabs tablet  Commonly known as:  XARELTO  Take  1 tablet (10 mg total) by mouth daily with breakfast. Xarelto 10 mg daily for ten days, then change to Aspirin 325 mg daily for two weeks, then reduce to Baby Aspirin 81 mg daily for three additional weeks.     traMADol 50 MG tablet  Commonly known as:  ULTRAM  Take 1-2 tablets (50-100 mg total) by mouth every 6 (six) hours as needed (mild pain).           Follow-up Information    Follow up with Gearlean Alf, MD. Schedule an appointment as soon as possible for a visit on 05/15/2015.   Specialty:  Orthopedic Surgery   Why:  Call office at [phone] to setup appointment on Tuesday 05/15/2015 with Dr. Wynelle Link.   Contact information:   [address] [place] [address] [phone]       Signed: Arlee Muslim, PA-C Orthopaedic Surgery 05/01/2015, 8:32 AM

## 2015-05-01 NOTE — Progress Notes (Signed)
Physical Therapy Evaluation Patient Details Name: Reginald Butler MRN: [id number] DOB: 02/01/1966 Today's Date: 05/01/2015   History of Present Illness  49 yo male s/p LEFT KNEE UNICOMPARTMENTAL ARTHROPLASTY  Clinical Impression  Pt s/p L UKR presents with decreased L LE strength/ROM and post op pain limiting functional mobility.  Pt should progress well to dc home with family assist and HHPT follow up.    Follow Up Recommendations Home health PT    Equipment Recommendations  None recommended by PT    Recommendations for Other Services OT consult     Precautions / Restrictions Precautions Precautions: Knee;Fall Precaution Comments: reviewed no pillow under knee and benefit of KI Required Braces or Orthoses: Knee Immobilizer - Right Knee Immobilizer - Right: Discontinue once straight leg raise with < 10 degree lag Restrictions Weight Bearing Restrictions: No RLE Weight Bearing: Weight bearing as tolerated      Mobility  Bed Mobility Overal bed mobility: Needs Assistance Bed Mobility: Supine to Sit     Supine to sit: Min guard Sit to supine: Supervision   General bed mobility comments: cues for sequence and use of L LE to self assist  Transfers Overall transfer level: Needs assistance Equipment used: Rolling walker (2 wheeled) Transfers: Sit to/from Stand Sit to Stand: Min guard         General transfer comment: Cues for technique, LE placement, and hand placement  Ambulation/Gait Ambulation/Gait assistance: Min assist;Min guard Ambulation Distance (Feet): 111 Feet Assistive device: Rolling walker (2 wheeled) Gait Pattern/deviations: Step-to pattern;Decreased step length - right;Decreased step length - left;Shuffle;Trunk flexed Gait velocity: decr   General Gait Details: cues for sequence, posture and position from [employer]            Wheelchair Mobility    Modified Rankin (Stroke Patients Only)       Balance Overall balance assessment:  Needs assistance Sitting-balance support: No upper extremity supported Sitting balance-Leahy Scale: Good     Standing balance support: Bilateral upper extremity supported Standing balance-Leahy Scale: Fair                               Pertinent Vitals/Pain Pain Assessment: 0-10 Pain Score: 3  Pain Location: L knee Pain Descriptors / Indicators: Aching;Sore Pain Intervention(s): Limited activity within patient's tolerance;Monitored during session;Premedicated before session;Ice applied    Home Living Family/patient expects to be discharged to:: Private residence Living Arrangements: Spouse/significant other Available Help at Discharge: Family;Available 24 hours/day Type of Home: House Home Access: Stairs to enter Entrance Stairs-Rails: Right Entrance Stairs-Number of Steps: 3 Home Layout: One level Home Equipment: Walker - 2 wheels;Shower seat;Grab bars - toilet;Grab bars - tub/shower;Crutches      Prior Function Level of Independence: Independent               Hand Dominance   Dominant Hand: Right    Extremity/Trunk Assessment   Upper Extremity Assessment: Overall WFL for tasks assessed           Lower Extremity Assessment: RLE deficits/detail RLE Deficits / Details: 3-/5 quads with AAROM at knee -8 - 85    Cervical / Trunk Assessment: Normal  Communication   Communication: No difficulties  Cognition Arousal/Alertness: Awake/alert Behavior During Therapy: WFL for tasks assessed/performed Overall Cognitive Status: Within Functional Limits for tasks assessed                      General Comments  Exercises Total Joint Exercises Ankle Circles/Pumps: AROM;Both;15 reps;Supine Quad Sets: AROM;Both;10 reps;Supine Heel Slides: AAROM;10 reps;Supine;Left Straight Leg Raises: Left;AAROM;10 reps;Supine      Assessment/Plan    PT Assessment Patient needs continued PT services  PT Diagnosis Difficulty walking   PT Problem List  Decreased strength;Decreased range of motion;Decreased activity tolerance;Decreased mobility;Decreased knowledge of use of DME;Pain  PT Treatment Interventions DME instruction;Gait training;Stair training;Functional mobility training;Therapeutic activities;Therapeutic exercise;Patient/family education   PT Goals (Current goals can be found in the Care Plan section) Acute Rehab PT Goals Patient Stated Goal: go home today! PT Goal Formulation: With patient Potential to Achieve Goals: Good    Frequency 7X/week   Barriers to discharge        Co-evaluation               End of Session Equipment Utilized During Treatment: Left knee immobilizer;Gait belt Activity Tolerance: Patient tolerated treatment well Patient left: in chair;with call bell/phone within reach;with family/visitor present Nurse Communication: Mobility status    Functional Assessment Tool Used: Clinical judgement Functional Limitation: Mobility: Walking and moving around Mobility: Walking and Moving Around Current Status ([id number]): At least 20 percent but less than 40 percent impaired, limited or restricted Mobility: Walking and Moving Around Goal Status [phone]([id number]): At least 1 percent but less than 20 percent impaired, limited or restricted    Time: 0901-0936 PT Time Calculation (min) (ACUTE ONLY): 35 min   Charges:   PT Evaluation $Initial PT Evaluation Tier I: 1 Procedure PT Treatments $Therapeutic Exercise: 8-22 mins   PT G Codes:   PT G-Codes **NOT FOR INPATIENT CLASS** Functional Assessment Tool Used: Clinical judgement Functional Limitation: Mobility: Walking and moving around Mobility: Walking and Moving Around Current Status ([id number]([id number]): At least 20 percent but less than 40 percent impaired, limited or restricted Mobility: Walking and Moving Around Goal Status [phone]([id number]): At least 1 percent but less than 20 percent impaired, limited or restricted    Reginald Butler 05/01/2015, 11:55 AM

## 2015-05-01 NOTE — Evaluation (Signed)
Occupational Therapy Evaluation Patient Details Name: Reginald Butler MRN: [id number] DOB: 09-29-1965 Today's Date: 05/01/2015    History of Present Illness 49 yo male s/p LEFT KNEE UNICOMPARTMENTAL ARTHROPLASTY   Clinical Impression   Patient presenting with decreased ADL and functional mobility independence secondary to above. Patient independent PTA. Patient currently functioning at an overall supervision to min/mod assist level. Patient will benefit from acute OT to increase overall independence in the areas of ADLs, functional mobility, and overall safety in order to safely discharge home with wife and family.     Follow Up Recommendations  No OT follow up;Supervision - Intermittent    Equipment Recommendations  None recommended by OT    Recommendations for Other Services  None at this time   Precautions / Restrictions Precautions Precautions: Knee;Fall Precaution Comments: reviewed no pillow under knee and benefit of KI Required Braces or Orthoses: Knee Immobilizer - Right Restrictions Weight Bearing Restrictions: Yes RLE Weight Bearing: Weight bearing as tolerated    Mobility Bed Mobility Overal bed mobility: Needs Assistance Bed Mobility: Sit to Supine       Sit to supine: Supervision   General bed mobility comments: Pt able to manage RLE, supervision for safety  Transfers Overall transfer level: Needs assistance Equipment used: Rolling walker (2 wheeled) Transfers: Sit to/from Stand Sit to Stand: Min guard General transfer comment: Cues for technique, LE placement, and hand placement    Balance Overall balance assessment: Needs assistance Sitting-balance support: No upper extremity supported Sitting balance-Leahy Scale: Good     Standing balance support: Bilateral upper extremity supported;During functional activity Standing balance-Leahy Scale: Fair    ADL Overall ADL's : Needs assistance/impaired Eating/Feeding: Set up;Sitting   Grooming:  Supervision/safety;Standing   Upper Body Bathing: Set up;Sitting   Lower Body Bathing: Minimal assistance;Sit to/from stand   Upper Body Dressing : Set up;Sitting   Lower Body Dressing: Moderate assistance;Sit to/from stand   Toilet Transfer: Min guard;RW;BSC Functional mobility during ADLs: Supervision/safety;Min guard;Cueing for safety;Rolling walker General ADL Comments: Minimal cues for safety and technique.     Pertinent Vitals/Pain Pain Assessment: 0-10 Pain Score: 2  Pain Location: left knee Pain Descriptors / Indicators: Aching;Discomfort Pain Intervention(s): Monitored during session;Repositioned     Hand Dominance Right   Extremity/Trunk Assessment Upper Extremity Assessment Upper Extremity Assessment: Overall WFL for tasks assessed   Lower Extremity Assessment Lower Extremity Assessment: Defer to PT evaluation   Cervical / Trunk Assessment Cervical / Trunk Assessment: Normal   Communication Communication Communication: No difficulties   Cognition Arousal/Alertness: Awake/alert Behavior During Therapy: WFL for tasks assessed/performed Overall Cognitive Status: Within Functional Limits for tasks assessed              Home Living Family/patient expects to be discharged to:: Private residence Living Arrangements: Spouse/significant other Available Help at Discharge: Family;Available 24 hours/day Type of Home: House Home Access: Stairs to enter Entergy CorporationEntrance Stairs-Number of Steps: 3   Home Layout: One level     Bathroom Shower/Tub: Producer, television/film/videoWalk-in shower   Bathroom Toilet: Handicapped height     Home Equipment: Environmental consultantWalker - 2 wheels;Shower seat;Grab bars - toilet;Grab bars - tub/shower   Prior Functioning/Environment Level of Independence: Independent     OT Diagnosis: Generalized weakness   OT Problem List: Decreased strength;Decreased range of motion;Decreased activity tolerance;Impaired balance (sitting and/or standing);Decreased safety awareness;Decreased  knowledge of use of DME or AE;Pain   OT Treatment/Interventions: Self-care/ADL training;Therapeutic exercise;Energy conservation;DME and/or AE instruction;Therapeutic activities;Patient/family education;Balance training    OT Goals(Current goals can be  found in the care plan section) Acute Rehab OT Goals Patient Stated Goal: go home today! OT Goal Formulation: With patient/family Time For Goal Achievement: 05/15/15 Potential to Achieve Goals: Good ADL Goals Pt Will Perform Grooming: with modified independence;standing Pt Will Perform Lower Body Bathing: with modified independence;sit to/from stand Pt Will Perform Lower Body Dressing: with modified independence;sit to/from stand Pt Will Transfer to Toilet: with modified independence;ambulating;bedside commode Pt Will Perform Tub/Shower Transfer: Shower transfer;ambulating;shower seat;rolling walker;with modified independence Additional ADL Goal #1: Pt will be mod I with functional mobility using RW  OT Frequency: Min 2X/week   Barriers to D/C: None known at this time   End of Session Equipment Utilized During Treatment: Gait belt;Rolling walker;Right knee immobilizer Nurse Communication: Patient requests pain meds  Activity Tolerance: Patient tolerated treatment well Patient left: in bed;with call bell/phone within reach;with family/visitor present   Time: 1025-1051 OT Time Calculation (min): 26 min Charges:  OT General Charges $OT Visit: 1 Procedure OT Evaluation $Initial OT Evaluation Tier I: 1 Procedure OT Treatments $Self Care/Home Management : 8-22 mins  Lavern Maslow , MS, OTR/L, CLT Pager: [phone]  05/01/2015, 11:05 AM

## 2015-05-01 NOTE — Care Management Note (Signed)
Case Management Note  Patient Details  Name: Reginald Butler MRN: [id number] Date of Birth: 17-Sep-1965  Subjective/Objective:                  LEFT KNEE UNICOMPARTMENTAL ARTHROPLASTY Action/Plan: Discharge planning Expected Discharge Date:  05/01/15               Expected Discharge Plan:  [place]  In-House Referral:     Discharge planning Services  CM Consult  Post Acute Care Choice:  Home Health Choice offered to:  Patient, Spouse  DME Arranged:  N/A DME Agency:  NA  HH Arranged:  PT HH Agency:  [facility]  Status of Service:  Completed, signed off  Medicare Important Message Given:    Date Medicare IM Given:    Medicare IM give by:    Date Additional Medicare IM Given:    Additional Medicare Important Message give by:     If discussed at [place] of Stay Meetings, dates discussed:    Additional Comments: CM met with pt in room to offer choice of home health agency.  Pt chooses [facility] to render HHPT.  Referral given to [facility] rep, Tim (on unit).  Pt has safety bars and elevated toilet at home and a rolling walker at home.  No other CM needs were communicated. Dellie Catholic, RN 05/01/2015, 12:11 PM

## 2015-05-01 NOTE — Discharge Instructions (Addendum)
° °Dr. Frank Aluisio °Total Joint Specialist °[facility] °[address] °[place], [place] [address] °[phone] ° °UNI KNEE REPLACEMENT POSTOPERATIVE DIRECTIONS ° ° °Knee Rehabilitation, Guidelines Following Surgery  °Results after knee surgery are often greatly improved when you follow the exercise, range of motion and muscle strengthening exercises prescribed by your doctor. Safety measures are also important to protect the knee from further injury. Any time any of these exercises cause you to have increased pain or swelling in your knee joint, decrease the amount until you are comfortable again and slowly increase them. If you have problems or questions, call your caregiver or physical therapist for advice.  ° °HOME CARE INSTRUCTIONS  °Remove items at home which could result in a fall. This includes throw rugs or furniture in walking pathways.  °· ICE to the affected knee every three hours for 30 minutes at a time and then as needed for pain and swelling.  Continue to use ice on the knee for pain and swelling from surgery. You may notice swelling that will progress down to the foot and ankle.  This is normal after surgery.  Elevate the leg when you are not up walking on it.   °· Continue to use the breathing machine which will help keep your temperature down.  It is common for your temperature to cycle up and down following surgery, especially at night when you are not up moving around and exerting yourself.  The breathing machine keeps your lungs expanded and your temperature down. °· Do not place pillow under knee, focus on keeping the knee straight while resting ° °DIET °You may resume your previous home diet once your are discharged from the hospital. ° °DRESSING / WOUND CARE / SHOWERING °You may shower 3 days after surgery, but keep the wounds dry during showering.  You may use an occlusive plastic wrap (Press'n Seal for example), NO SOAKING/SUBMERGING IN THE BATHTUB.  If the  bandage gets wet, change with a clean dry gauze.  If the incision gets wet, pat the wound dry with a clean towel. °You may start showering once you are discharged home but do not submerge the incision under water. Just pat the incision dry and apply a dry gauze dressing on daily. °Change the surgical dressing daily and reapply a dry dressing each time. ° °ACTIVITY °Walk with your walker as instructed. °Use walker as long as suggested by your caregivers. °Avoid periods of inactivity such as sitting longer than an hour when not asleep. This helps prevent blood clots.  °You may resume a sexual relationship in one month or when given the OK by your doctor.  °You may return to work once you are cleared by your doctor.  °Do not drive a car for 6 weeks or until released by you surgeon.  °Do not drive while taking narcotics. ° °WEIGHT BEARING °Weight bearing as tolerated with assist device (walker, cane, etc) as directed, use it as long as suggested by your surgeon or therapist, typically at least 4-6 weeks. ° °POSTOPERATIVE CONSTIPATION PROTOCOL °Constipation - defined medically as fewer than three stools per week and severe constipation as less than one stool per week. ° °One of the most common issues patients have following surgery is constipation.  Even if you have a regular bowel pattern at home, your normal regimen is likely to be disrupted due to multiple reasons following surgery.  Combination of anesthesia, postoperative narcotics, change in appetite and fluid intake all can affect your bowels.    In order to avoid complications following surgery, here are some recommendations in order to help you during your recovery period.  Colace (docusate) - Pick up an over-the-counter form of Colace or another stool softener and take twice a day as long as you are requiring postoperative pain medications.  Take with a full glass of water daily.  If you experience loose stools or diarrhea, hold the colace until you stool forms  back up.  If your symptoms do not get better within 1 week or if they get worse, check with your doctor.  Dulcolax (bisacodyl) - Pick up over-the-counter and take as directed by the product packaging as needed to assist with the movement of your bowels.  Take with a full glass of water.  Use this product as needed if not relieved by Colace only.   MiraLax (polyethylene glycol) - Pick up over-the-counter to have on hand.  MiraLax is a solution that will increase the amount of water in your bowels to assist with bowel movements.  Take as directed and can mix with a glass of water, juice, soda, coffee, or tea.  Take if you go more than two days without a movement. Do not use MiraLax more than once per day. Call your doctor if you are still constipated or irregular after using this medication for 7 days in a row.  If you continue to have problems with postoperative constipation, please contact the office for further assistance and recommendations.  If you experience "the worst abdominal pain ever" or develop nausea or vomiting, please contact the office immediatly for further recommendations for treatment.  ITCHING  If you experience itching with your medications, try taking only a single pain pill, or even half a pain pill at a time.  You can also use Benadryl over the counter for itching or also to help with sleep.   TED HOSE STOCKINGS Wear the elastic stockings on both legs for three weeks following surgery during the day but you may remove then at night for sleeping.  MEDICATIONS See your medication summary on the After Visit Summary that the nursing staff will review with you prior to discharge.  You may have some home medications which will be placed on hold until you complete the course of blood thinner medication.  It is important for you to complete the blood thinner medication as prescribed by your surgeon.  Continue your approved medications as instructed at time of  discharge.  PRECAUTIONS If you experience chest pain or shortness of breath - call 911 immediately for transfer to the hospital emergency department.  If you develop a fever greater that 101 F, purulent drainage from wound, increased redness or drainage from wound, foul odor from the wound/dressing, or calf pain - CONTACT YOUR SURGEON.                                                   FOLLOW-UP APPOINTMENTS Make sure you keep all of your appointments after your operation with your surgeon and caregivers. You should call the office at the above phone number and make an appointment for approximately two weeks after the date of your surgery or on the date instructed by your surgeon outlined in the "After Visit Summary".  RANGE OF MOTION AND STRENGTHENING EXERCISES  Rehabilitation of the knee is important following a knee injury or an  operation. After just a few days of immobilization, the muscles of the thigh which control the knee become weakened and shrink (atrophy). Knee exercises are designed to build up the tone and strength of the thigh muscles and to improve knee motion. Often times heat used for twenty to thirty minutes before working out will loosen up your tissues and help with improving the range of motion but do not use heat for the first two weeks following surgery. These exercises can be done on a training (exercise) mat, on the floor, on a table or on a bed. Use what ever works the best and is most comfortable for you Knee exercises include:  Leg Lifts - While your knee is still immobilized in a splint or cast, you can do straight leg raises. Lift the leg to 60 degrees, hold for 3 sec, and slowly lower the leg. Repeat 10-20 times 2-3 times daily. Perform this exercise against resistance later as your knee gets better.  Quad and Hamstring Sets - Tighten up the muscle on the front of the thigh (Quad) and hold for 5-10 sec. Repeat this 10-20 times hourly. Hamstring sets are done by pushing the  foot backward against an object and holding for 5-10 sec. Repeat as with quad sets.   Leg Slides: Lying on your back, slowly slide your foot toward your buttocks, bending your knee up off the floor (only go as far as is comfortable). Then slowly slide your foot back down until your leg is flat on the floor again.  Angel Wings: Lying on your back spread your legs to the side as far apart as you can without causing discomfort.  A rehabilitation program following serious knee injuries can speed recovery and prevent re-injury in the future due to weakened muscles. Contact your doctor or a physical therapist for more information on knee rehabilitation.   IF YOU ARE TRANSFERRED TO A SKILLED REHAB FACILITY If the patient is transferred to a skilled rehab facility following release from the hospital, a list of the current medications will be sent to the facility for the patient to continue.  When discharged from the skilled rehab facility, please have the facility set up the patient's Home Health Physical Therapy prior to being released. Also, the skilled facility will be responsible for providing the patient with their medications at time of release from the facility to include their pain medication, the muscle relaxants, and their blood thinner medication. If the patient is still at the rehab facility at time of the two week follow up appointment, the skilled rehab facility will also need to assist the patient in arranging follow up appointment in our office and any transportation needs.  MAKE SURE YOU:  Understand these instructions.  Get help right away if you are not doing well or get worse.    Pick up stool softner and laxative for home use following surgery while on pain medications. Do not submerge incision under water. Please use good hand washing techniques while changing dressing each day. May shower starting three days after surgery. Please use a clean towel to pat the incision dry following  showers. Continue to use ice for pain and swelling after surgery. Do not use any lotions or creams on the incision until instructed by your surgeon.  -------------------------------------------------------------------------------------------------------------------------------  Take Xarelto 10 mg daily for ten days, then change to Aspirin 325 mg daily for two weeks, then reduce to Baby Aspirin 81 mg daily for three additional weeks.   ------------------------------------------------------------------------------------------------------------------------- Information on  my medicine - XARELTO (Rivaroxaban)  This medication education was reviewed with me or my healthcare representative as part of my discharge preparation.  The pharmacist that spoke with me during my hospital stay was:  Allix Blomquist E, [facility]  Wynonia HazardWhy was Xarelto prescribed for you? Xarelto was prescribed for you to reduce the risk of blood clots forming after orthopedic surgery. The medical term for these abnormal blood clots is venous thromboembolism (VTE).  What do you need to know about xarelto ? Take your Xarelto ONCE DAILY at the same time every day. You may take it either with or without food.  If you have difficulty swallowing the tablet whole, you may crush it and mix in applesauce just prior to taking your dose.  Take Xarelto exactly as prescribed by your doctor and DO NOT stop taking Xarelto without talking to the doctor who prescribed the medication.  Stopping without other VTE prevention medication to take the place of Xarelto may increase your risk of developing a clot.  After discharge, you should have regular check-up appointments with your healthcare provider that is prescribing your Xarelto.    What do you do if you miss a dose? If you miss a dose, take it as soon as you remember on the same day then continue your regularly scheduled once daily regimen the next day. Do not take two doses of Xarelto on  the same day.   Important Safety Information A possible side effect of Xarelto is bleeding. You should call your healthcare provider right away if you experience any of the following: ? Bleeding from an injury or your nose that does not stop. ? Unusual colored urine (red or dark brown) or unusual colored stools (red or black). ? Unusual bruising for unknown reasons. ? A serious fall or if you hit your head (even if there is no bleeding).  Some medicines may interact with Xarelto and might increase your risk of bleeding while on Xarelto. To help avoid this, consult your healthcare provider or pharmacist prior to using any new prescription or non-prescription medications, including herbals, vitamins, non-steroidal anti-inflammatory drugs (NSAIDs) and supplements.  This website has more information on Xarelto: [email or web address].

## 2015-05-01 NOTE — Progress Notes (Signed)
   Subjective: 1 Day Post-Op Procedure(s) (LRB): LEFT KNEE UNICOMPARTMENTAL ARTHROPLASTY (Left) Patient reports pain as moderate.   Patient seen in rounds with Dr. Lequita HaltAluisio.  Rough night but better this morning. Will work with therapy this morning and this afternoon and then home. Patient is well, but has had some minor complaints of pain in the knee, requiring pain medications Patient is ready to go home following therapy.  Objective: Vital signs in last 24 hours: Temp:  [97.1 F (36.2 C)-98.4 F (36.9 C)] 98 F (36.7 C) (11/08 0527) Pulse Rate:  [56-94] 76 (11/08 0527) Resp:  [11-16] 16 (11/08 0527) BP: (100-137)/(63-85) 113/63 mmHg (11/08 0527) SpO2:  [98 %-100 %] 100 % (11/08 0118) Weight:  [117.482 kg (259 lb)-117.935 kg (260 lb)] 117.935 kg (260 lb) (11/07 1700)  Intake/Output from previous day:  Intake/Output Summary (Last 24 hours) at 05/01/15 0823 Last data filed at 05/01/15 0600  Gross per 24 hour  Intake 4088.33 ml  Output   1740 ml  Net 2348.33 ml    Labs:  Recent Labs  05/01/15 0448  HGB 13.1    Recent Labs  05/01/15 0448  WBC 12.2*  RBC 4.07*  HCT 37.9*  PLT 208    Recent Labs  05/01/15 0448  NA 139  K 4.3  CL 109  CO2 22  BUN 14  CREATININE 1.16  GLUCOSE 161*  CALCIUM 8.8*   No results for input(s): LABPT, INR in the last 72 hours.  EXAM: General - Patient is Alert, Appropriate and Oriented Extremity - Neurovascular intact Sensation intact distally Dorsiflexion/Plantar flexion intact Dressing - clean, dry Motor Function - intact, moving foot and toes well on exam.  Hemovac pulled without difficulty.  Assessment/Plan: 1 Day Post-Op Procedure(s) (LRB): LEFT KNEE UNICOMPARTMENTAL ARTHROPLASTY (Left) Procedure(s) (LRB): LEFT KNEE UNICOMPARTMENTAL ARTHROPLASTY (Left) Past Medical History  Diagnosis Date  . Complication of anesthesia     MAY BE SLOW TO WAKE UP  . Sleep apnea     USES C PAP  . Acute meniscal tear of knee    LEFT  . Arthritis   . GERD (gastroesophageal reflux disease)   . History of kidney stones    Principal Problem:   OA (osteoarthritis) of knee  Estimated body mass index is 36.28 kg/(m^2) as calculated from the following:   Height as of this encounter: 5\' 11"  (1.803 m).   Weight as of this encounter: 117.935 kg (260 lb). Up with therapy Discharge home with home health Diet - Regular diet Follow up - in 2 weeks Activity - WBAT Dressing - May remove the surgical dressing tomorrow at home and then apply a dry gauze dressing daily. May shower three days following surgery but do not submerge the incision under water. Disposition - Home Condition Upon Discharge - Good D/C Meds - See DC Summary DVT Prophylaxis Xarelto 10 mg daily for ten days, then change to Aspirin 325 mg daily for two weeks, then reduce to Baby Aspirin 81 mg daily for three additional weeks.  Avel Peacerew Perkins, PA-C Orthopaedic Surgery 05/01/2015, 8:23 AM

## 2015-05-01 NOTE — Progress Notes (Signed)
Physical Therapy Treatment Patient Details Name: Reginald Butler MRN: [id number] DOB: [birth year] Today's Date: 05/01/2015    History of Present Illness 49 yo male s/p LEFT KNEE UNICOMPARTMENTAL ARTHROPLASTY    PT Comments    Pt progressing well with mobility and eager for return home.  Follow Up Recommendations  Home health PT     Equipment Recommendations  None recommended by PT    Recommendations for Other Services OT consult     Precautions / Restrictions Precautions Precautions: Knee;Fall Precaution Comments: reviewed no pillow under knee and benefit of KI Required Braces or Orthoses: Knee Immobilizer - Right Knee Immobilizer - Right: Discontinue once straight leg raise with < 10 degree lag (Pt performed IND SLR) Restrictions Weight Bearing Restrictions: No RLE Weight Bearing: Weight bearing as tolerated    Mobility  Bed Mobility Overal bed mobility: Needs Assistance Bed Mobility: Supine to Sit;Sit to Supine     Supine to sit: Supervision Sit to supine: Supervision   General bed mobility comments: cues for sequence and use of L LE to self assist  Transfers Overall transfer level: Needs assistance Equipment used: Rolling walker (2 wheeled) Transfers: Sit to/from Stand Sit to Stand: Supervision         General transfer comment: Cues for technique, LE placement, and hand placement  Ambulation/Gait Ambulation/Gait assistance: Min guard;Supervision Ambulation Distance (Feet): 100 Feet (twice - 100' with RW and 100' with crutches) Assistive device: Rolling walker (2 wheeled);Crutches Gait Pattern/deviations: Step-to pattern;Step-through pattern;Decreased step length - right;Decreased step length - left;Shuffle Gait velocity: decr   General Gait Details: cues for sequence, posture and position from RW   Stairs Stairs: Yes Stairs assistance: Min guard Stair Management: One rail Left;Step to pattern;Forwards;With crutches Number of Stairs: 4 General  stair comments: cues for sequence and foot/crutch placement  Wheelchair Mobility    Modified Rankin (Stroke Patients Only)       Balance                                    Cognition Arousal/Alertness: Awake/alert Behavior During Therapy: WFL for tasks assessed/performed Overall Cognitive Status: Within Functional Limits for tasks assessed                      Exercises Total Joint Exercises Ankle Circles/Pumps: AROM;Both;15 reps;Supine Quad Sets: AROM;Both;10 reps;Supine Heel Slides: AAROM;10 reps;Supine;Left Straight Leg Raises: Left;Supine;AAROM;AROM;15 reps    General Comments        Pertinent Vitals/Pain Pain Assessment: 0-10 Pain Score: 3  Pain Location: L knee Pain Descriptors / Indicators: Aching;Sore Pain Intervention(s): Limited activity within patient's tolerance;Monitored during session;Premedicated before session;Ice applied    Home Living                      Prior Function            PT Goals (current goals can now be found in the care plan section) Acute Rehab PT Goals Patient Stated Goal: go home today! PT Goal Formulation: With patient Potential to Achieve Goals: Good Progress towards PT goals: Progressing toward goals    Frequency  7X/week    PT Plan Current plan remains appropriate    Co-evaluation             End of Session Equipment Utilized During Treatment: Gait belt Activity Tolerance: Patient tolerated treatment well Patient left: in chair;with call bell/phone within reach;with family/visitor  present     Time: 1325-1403 PT Time Calculation (min) (ACUTE ONLY): 38 min  Charges:  $Gait Training: 23-37 mins $Therapeutic Exercise: 8-22 mins                    G Codes:      Ripley Lovecchio 05/01/2015, 3:42 PM

## 2015-05-02 ENCOUNTER — Encounter (HOSPITAL_COMMUNITY): Payer: Self-pay | Admitting: Orthopedic Surgery

## 2015-05-02 NOTE — Progress Notes (Signed)
   05/02/15 1200  OT G-codes **NOT FOR INPATIENT CLASS**  Functional Assessment Tool Used (clinical judgement)  Functional Limitation Self care  Self Care Current Status [phone]([id number]) CJ  Self Care Goal Status ([id number]([id number]) CI   Edwin CapPatricia Vicke Plotner, MS., OTR/L

## 2017-07-04 ENCOUNTER — Encounter: Payer: Self-pay | Admitting: Emergency Medicine

## 2017-07-04 ENCOUNTER — Other Ambulatory Visit: Payer: Self-pay

## 2017-07-04 ENCOUNTER — Emergency Department: Payer: 59

## 2017-07-04 ENCOUNTER — Emergency Department
Admission: EM | Admit: 2017-07-04 | Discharge: 2017-07-04 | Disposition: A | Discharge status: Home / Self Care | Payer: 59 | Attending: Emergency Medicine | Admitting: Emergency Medicine

## 2017-07-04 DIAGNOSIS — W1789XA Other fall from one level to another, initial encounter: Secondary | ICD-10-CM | POA: Insufficient documentation

## 2017-07-04 DIAGNOSIS — T07XXXA Unspecified multiple injuries, initial encounter: Secondary | ICD-10-CM

## 2017-07-04 DIAGNOSIS — Y939 Activity, unspecified: Secondary | ICD-10-CM | POA: Diagnosis not present

## 2017-07-04 DIAGNOSIS — S8991XA Unspecified injury of right lower leg, initial encounter: Secondary | ICD-10-CM | POA: Diagnosis present

## 2017-07-04 DIAGNOSIS — M79602 Pain in left arm: Secondary | ICD-10-CM

## 2017-07-04 DIAGNOSIS — S82891A Other fracture of right lower leg, initial encounter for closed fracture: Secondary | ICD-10-CM | POA: Diagnosis not present

## 2017-07-04 DIAGNOSIS — Y999 Unspecified external cause status: Secondary | ICD-10-CM | POA: Insufficient documentation

## 2017-07-04 DIAGNOSIS — Z79899 Other long term (current) drug therapy: Secondary | ICD-10-CM | POA: Diagnosis not present

## 2017-07-04 DIAGNOSIS — Y929 Unspecified place or not applicable: Secondary | ICD-10-CM | POA: Insufficient documentation

## 2017-07-04 DIAGNOSIS — Z96652 Presence of left artificial knee joint: Secondary | ICD-10-CM | POA: Diagnosis not present

## 2017-07-04 MED ORDER — HYDROMORPHONE HCL 1 MG/ML IJ SOLN
0.5000 mg | Freq: Once | INTRAMUSCULAR | Status: AC
  Administered 2017-07-04: 0.5 mg via INTRAVENOUS
  Filled 2017-07-04: qty 1

## 2017-07-04 MED ORDER — IBUPROFEN 800 MG PO TABS
800.0000 mg | ORAL_TABLET | Freq: Once | ORAL | Status: AC
  Administered 2017-07-04: 800 mg via ORAL
  Filled 2017-07-04: qty 1

## 2017-07-04 MED ORDER — ONDANSETRON HCL 4 MG/2ML IJ SOLN
4.0000 mg | Freq: Once | INTRAMUSCULAR | Status: AC
  Administered 2017-07-04: 4 mg via INTRAVENOUS
  Filled 2017-07-04: qty 2

## 2017-07-04 NOTE — Discharge Instructions (Signed)
You are evaluated after fall and found to have right fibula fracture which was placed in a splint.  Please follow-up with orthopedic doctor for further treatment which might include cast versus boot placement.  You also have bruising and abrasions to the shins.  Return to the emergency department immediately for any worsening condition including trouble breathing, nor worsening or uncontrolled pain, or any other symptoms concerning to you.

## 2017-07-04 NOTE — ED Provider Notes (Signed)
[facility] Emergency Department Provider Note ____________________________________________   I have reviewed the triage vital signs and the triage nursing note.  HISTORY  Chief Complaint Fall   Historian Patient  HPI Reginald Butler is a 52 y.o. male presents by EMS after fall from trailer, approximately 12 feet.  Landed on to his feet and is complaining of severe right ankle pain.  He states the wind was sort of knocked out of him, but no reported head injury or loss of consciousness.  No neck pain or back pain.  He states he has some soreness to the left shoulder but he is able to move and range it appropriately.  He feels like he has some soreness to the shins on both sides but most severe pain is at the right ankle.     Past Medical History:  Diagnosis Date  . Acute meniscal tear of knee    LEFT  . Arthritis   . Complication of anesthesia    MAY BE SLOW TO WAKE UP  . GERD (gastroesophageal reflux disease)   . History of kidney stones   . Sleep apnea    USES C PAP    Patient Active Problem List   Diagnosis Date Noted  . OA (osteoarthritis) of knee 04/30/2015  . Acute medial meniscal tear 05/31/2014    Past Surgical History:  Procedure Laterality Date  . APPENDECTOMY  2006  . CATARACTS REMOVED Bilateral   . FOOT SURGERY  1989   LEFT  . KNEE ARTHROSCOPY  1999   LEFT  . KNEE ARTHROSCOPY Left 05/31/2014   Procedure: LEFT ARTHROSCOPY KNEE WITH MEDIAL MENISCAL  DEBRIDMENT, CHONDROPLASTY;  Surgeon: Loanne Drilling, MD;  Location: [facility];  Service: Orthopedics;  Laterality: Left;  . PARTIAL KNEE ARTHROPLASTY Left 04/30/2015   Procedure: LEFT KNEE UNICOMPARTMENTAL ARTHROPLASTY;  Surgeon: Ollen Gross, MD;  Location: [facility];  Service: Orthopedics;  Laterality: Left;    Prior to Admission medications   Medication Sig Start Date End Date Taking? Authorizing Provider  celecoxib (CELEBREX) 200 MG capsule Take 200 mg by mouth daily.   Yes  [provider]  fexofenadine (ALLEGRA) 180 MG tablet Take 180 mg by mouth every morning.   Yes [provider]  lansoprazole (PREVACID) 30 MG capsule Take 30 mg by mouth daily at 12 noon.   Yes [provider]  methocarbamol (ROBAXIN) 500 MG tablet Take 1 tablet (500 mg total) by mouth every 6 (six) hours as needed for muscle spasms. Patient not taking: Reported on 07/04/2017 05/01/15   Julien Girt, Alexzandrew L, PA-C  oxyCODONE (OXY IR/ROXICODONE) 5 MG immediate release tablet Take 1-2 tablets (5-10 mg total) by mouth every 3 (three) hours as needed for moderate pain or severe pain. Patient not taking: Reported on 07/04/2017 05/01/15   Julien Girt, Alexzandrew L, PA-C  rivaroxaban (XARELTO) 10 MG TABS tablet Take 1 tablet (10 mg total) by mouth daily with breakfast. Xarelto 10 mg daily for ten days, then change to Aspirin 325 mg daily for two weeks, then reduce to Baby Aspirin 81 mg daily for three additional weeks. Patient not taking: Reported on 07/04/2017 05/01/15   Julien Girt, Alexzandrew L, PA-C  traMADol (ULTRAM) 50 MG tablet Take 1-2 tablets (50-100 mg total) by mouth every 6 (six) hours as needed (mild pain). Patient not taking: Reported on 07/04/2017 05/01/15   Julien Girt, Alexzandrew L, PA-C    Allergies  Allergen Reactions  . Milk-Related Compounds Other (See Comments)    Throat irritation; constant  need to clear throat  . Other Other (See Comments)    Heavy Pain Medicine--Makes light headed.     History reviewed. No pertinent family history.  Social History Social History   Tobacco Use  . Smoking status: Never Smoker  . Smokeless tobacco: Never Used  Substance Use Topics  . Alcohol use: No  . Drug use: No    Review of Systems  Constitutional: Negative for illness. Eyes: Negative for visual changes. ENT: Negative for sore throat. Cardiovascular: Negative for chest pain. Respiratory: Negative for shortness of breath.  Wears CPAP at night. Gastrointestinal:  Negative for abdominal pain Genitourinary: Negative for dysuria. Musculoskeletal: Negative for back pain. Skin: Negative for rash. Neurological: Negative for headache.  ____________________________________________   PHYSICAL EXAM:  VITAL SIGNS: ED Triage Vitals  Enc Vitals Group     BP --      Pulse Rate 07/04/17 1309 72     Resp 07/04/17 1309 16     Temp 07/04/17 1309 98.2 F (36.8 C)     Temp Source 07/04/17 1309 Oral     SpO2 07/04/17 1309 98 %     Weight 07/04/17 1310 257 lb (116.6 kg)     Height 07/04/17 1310 5\' 11"  (1.803 m)     Head Circumference --      Peak Flow --      Pain Score 07/04/17 1309 4     Pain Loc --      Pain Edu? --      Excl. in GC? --      Constitutional: Alert and oriented. Well appearing and in no distress. HEENT   Head: Normocephalic and atraumatic.      Eyes: Conjunctivae are normal. Pupils equal and round.       Ears:         Nose: No congestion/rhinnorhea.   Mouth/Throat: Mucous membranes are moist.   Neck: No stridor.  Posterior midline C-spine tenderness to palpation and range of motion. Cardiovascular/Chest: Normal rate, regular rhythm.  No murmurs, rubs, or gallops. Respiratory: Normal respiratory effort without tachypnea nor retractions. Breath sounds are clear and equal bilaterally. No wheezes/rales/rhonchi. Gastrointestinal: Soft. No distention, no guarding, no rebound. Nontender.    Genitourinary/rectal:Deferred Musculoskeletal: Stable nontender.  Upper extremities without any bony tenderness.  He has some mild soft tissue soreness to the left upper arm.  Neurovascularly intact in 4 extremities.  Patient has early bruising and some minor abrasions to both shins.  He has severe tenderness to the right ankle with mild deformity/moderate swelling.  No hip pain.  No back pain. Neurologic:  Normal speech and language. No gross or focal neurologic deficits are appreciated. Skin:  Skin is warm, dry and intact. No rash  noted. Psychiatric: Mood and affect are normal. Speech and behavior are normal. Patient exhibits appropriate insight and judgment.   ____________________________________________  LABS (pertinent positives/negatives) I, Governor Rooks, MD the attending physician have reviewed the labs noted below.  Labs Reviewed - No data to display  ____________________________________________    EKG I, Governor Rooks, MD, the attending physician have personally viewed and interpreted all ECGs.  None ____________________________________________  RADIOLOGY All Xrays were viewed by me.  Imaging interpreted by Radiologist, and I, Governor Rooks, MD the attending physician have reviewed the radiologist interpretation noted below.  DG right ankle complete:  FINDINGS: Transverse fracture through the lateral malleolus, mildly displaced. Overlying soft tissue swelling. No tibial fracture visualized.  IMPRESSION: Mildly displaced lateral malleolar fracture. __________________________________________  PROCEDURES  Procedure(s) performed:  SPLINT APPLICATION Date/Time: 3:32 PM Authorized by: Governor Rooksebecca Amiri Tritch Consent: Verbal consent obtained. Risks and benefits: risks, benefits and alternatives were discussed Consent given by: patient Splint applied by: Nurse Location details: Right leg Splint type: Posterior short leg Supplies used: Ortho-Glass and Ace wrap    Critical Care performed: None   ____________________________________________  ED COURSE / ASSESSMENT AND PLAN  Pertinent labs & imaging results that were available during my care of the patient were reviewed by me and considered in my medical decision making (see chart for details).    Overall primary and secondary exam are overall reassuring, on secondary exam he has tenderness and swelling at the right lateral ankle and on x-ray this is confirmed to be a isolated fibula fracture.  Patient placed in a short-leg splint.  He has some  soreness to the soft tissues of the left arm, no bony tenderness there.  His tetanus status is up-to-date within the last 3 years.  No additional concerning findings for head, neck, back, chest or abdominal trauma.  Patient will be referred to outpatient orthopedics for close follow-up as well as his primary care doctor.  DIFFERENTIAL DIAGNOSIS: Including but not limited to multi trauma including head injury, neck injury, back injury, intrathoracic or intra-abdominal injury, bony extremity injury including fractures and soft tissue injury, etc.  CONSULTATIONS:   None  Patient / Family / Caregiver informed of clinical course, medical decision-making process, and agree with plan.   I discussed return precautions, follow-up instructions, and discharge instructions with patient and/or family.  Discharge Instructions : You are evaluated after fall and found to have right fibula fracture which was placed in a splint.  Please follow-up with orthopedic doctor for further treatment which might include cast versus boot placement.  You also have bruising and abrasions to the shins.  Return to the emergency department immediately for any worsening condition including trouble breathing, nor worsening or uncontrolled pain, or any other symptoms concerning to you.    ___________________________________________   FINAL CLINICAL IMPRESSION(S) / ED DIAGNOSES   Final diagnoses:  Fracture of malleolus of right ankle, closed, initial encounter  Multiple abrasions  Musculoskeletal arm pain, left      ___________________________________________        Note: This dictation was prepared with Dragon dictation. Any transcriptional errors that result from this process are unintentional    Governor RooksLord, Belia Febo, MD 07/04/17 [phone]

## 2017-07-04 NOTE — ED Triage Notes (Signed)
Pt to ED via EMS from home c/o fall from 12' trailer today, landed on his back on plastic barrel.  Denies LOC, abrasions to bilateral shins, pain and swelling to right ankle.  Ankle splinted by EMS.  EMS vitals 118/86 BP, 66 HR, 98% RA, 132 CBG.  75mg  fentanyl given en route.

## 2017-11-28 ENCOUNTER — Other Ambulatory Visit: Payer: Self-pay | Admitting: Sports Medicine

## 2017-11-28 DIAGNOSIS — S8264XK Nondisplaced fracture of lateral malleolus of right fibula, subsequent encounter for closed fracture with nonunion: Secondary | ICD-10-CM

## 2017-12-08 ENCOUNTER — Ambulatory Visit
Admission: RE | Admit: 2017-12-08 | Discharge: 2017-12-08 | Disposition: A | Discharge status: Home / Self Care | Payer: BLUE CROSS/BLUE SHIELD | Source: Ambulatory Visit | Attending: Sports Medicine | Admitting: Sports Medicine

## 2017-12-08 DIAGNOSIS — S8264XK Nondisplaced fracture of lateral malleolus of right fibula, subsequent encounter for closed fracture with nonunion: Secondary | ICD-10-CM

## 2018-07-28 DIAGNOSIS — S46112A Strain of muscle, fascia and tendon of long head of biceps, left arm, initial encounter: Secondary | ICD-10-CM | POA: Diagnosis not present

## 2018-07-28 DIAGNOSIS — E669 Obesity, unspecified: Secondary | ICD-10-CM | POA: Diagnosis not present

## 2018-07-28 DIAGNOSIS — S46111A Strain of muscle, fascia and tendon of long head of biceps, right arm, initial encounter: Secondary | ICD-10-CM | POA: Diagnosis not present

## 2018-07-28 DIAGNOSIS — S46802A Unspecified injury of other muscles, fascia and tendons at shoulder and upper arm level, left arm, initial encounter: Secondary | ICD-10-CM | POA: Diagnosis not present

## 2018-07-28 DIAGNOSIS — M7521 Bicipital tendinitis, right shoulder: Secondary | ICD-10-CM | POA: Diagnosis not present

## 2018-07-28 DIAGNOSIS — S46812A Strain of other muscles, fascia and tendons at shoulder and upper arm level, left arm, initial encounter: Secondary | ICD-10-CM | POA: Diagnosis not present

## 2018-07-28 DIAGNOSIS — W1789XA Other fall from one level to another, initial encounter: Secondary | ICD-10-CM | POA: Diagnosis not present

## 2018-07-28 DIAGNOSIS — S46011A Strain of muscle(s) and tendon(s) of the rotator cuff of right shoulder, initial encounter: Secondary | ICD-10-CM | POA: Diagnosis not present

## 2018-07-28 DIAGNOSIS — G8918 Other acute postprocedural pain: Secondary | ICD-10-CM | POA: Diagnosis not present

## 2018-07-28 DIAGNOSIS — K219 Gastro-esophageal reflux disease without esophagitis: Secondary | ICD-10-CM | POA: Diagnosis not present

## 2018-07-28 DIAGNOSIS — S43081A Other subluxation of right shoulder joint, initial encounter: Secondary | ICD-10-CM | POA: Diagnosis not present

## 2018-07-28 DIAGNOSIS — G4733 Obstructive sleep apnea (adult) (pediatric): Secondary | ICD-10-CM | POA: Diagnosis not present

## 2018-08-04 DIAGNOSIS — I889 Nonspecific lymphadenitis, unspecified: Secondary | ICD-10-CM | POA: Diagnosis not present

## 2018-08-06 DIAGNOSIS — I889 Nonspecific lymphadenitis, unspecified: Secondary | ICD-10-CM | POA: Diagnosis not present

## 2018-08-09 DIAGNOSIS — S46802A Unspecified injury of other muscles, fascia and tendons at shoulder and upper arm level, left arm, initial encounter: Secondary | ICD-10-CM | POA: Diagnosis not present

## 2018-08-17 DIAGNOSIS — E785 Hyperlipidemia, unspecified: Secondary | ICD-10-CM | POA: Diagnosis not present

## 2018-08-17 DIAGNOSIS — I889 Nonspecific lymphadenitis, unspecified: Secondary | ICD-10-CM | POA: Diagnosis not present

## 2018-08-17 DIAGNOSIS — B354 Tinea corporis: Secondary | ICD-10-CM | POA: Diagnosis not present

## 2018-08-17 DIAGNOSIS — Z1389 Encounter for screening for other disorder: Secondary | ICD-10-CM | POA: Diagnosis not present

## 2018-08-17 DIAGNOSIS — L723 Sebaceous cyst: Secondary | ICD-10-CM | POA: Diagnosis not present

## 2018-08-18 DIAGNOSIS — G4733 Obstructive sleep apnea (adult) (pediatric): Secondary | ICD-10-CM | POA: Diagnosis not present

## 2018-08-19 DIAGNOSIS — S46802A Unspecified injury of other muscles, fascia and tendons at shoulder and upper arm level, left arm, initial encounter: Secondary | ICD-10-CM | POA: Diagnosis not present

## 2018-08-26 DIAGNOSIS — S46802A Unspecified injury of other muscles, fascia and tendons at shoulder and upper arm level, left arm, initial encounter: Secondary | ICD-10-CM | POA: Diagnosis not present

## 2018-08-30 DIAGNOSIS — L729 Follicular cyst of the skin and subcutaneous tissue, unspecified: Secondary | ICD-10-CM | POA: Diagnosis not present

## 2018-08-30 DIAGNOSIS — L723 Sebaceous cyst: Secondary | ICD-10-CM | POA: Diagnosis not present

## 2018-08-30 DIAGNOSIS — S46802A Unspecified injury of other muscles, fascia and tendons at shoulder and upper arm level, left arm, initial encounter: Secondary | ICD-10-CM | POA: Diagnosis not present

## 2018-09-09 DIAGNOSIS — S46802A Unspecified injury of other muscles, fascia and tendons at shoulder and upper arm level, left arm, initial encounter: Secondary | ICD-10-CM | POA: Diagnosis not present

## 2018-09-13 DIAGNOSIS — S46802A Unspecified injury of other muscles, fascia and tendons at shoulder and upper arm level, left arm, initial encounter: Secondary | ICD-10-CM | POA: Diagnosis not present

## 2018-09-16 DIAGNOSIS — S46802A Unspecified injury of other muscles, fascia and tendons at shoulder and upper arm level, left arm, initial encounter: Secondary | ICD-10-CM | POA: Diagnosis not present

## 2018-09-20 DIAGNOSIS — S46802A Unspecified injury of other muscles, fascia and tendons at shoulder and upper arm level, left arm, initial encounter: Secondary | ICD-10-CM | POA: Diagnosis not present

## 2018-09-23 DIAGNOSIS — S46802A Unspecified injury of other muscles, fascia and tendons at shoulder and upper arm level, left arm, initial encounter: Secondary | ICD-10-CM | POA: Diagnosis not present

## 2018-09-27 DIAGNOSIS — S46802A Unspecified injury of other muscles, fascia and tendons at shoulder and upper arm level, left arm, initial encounter: Secondary | ICD-10-CM | POA: Diagnosis not present

## 2018-09-30 DIAGNOSIS — S46802A Unspecified injury of other muscles, fascia and tendons at shoulder and upper arm level, left arm, initial encounter: Secondary | ICD-10-CM | POA: Diagnosis not present

## 2018-10-04 DIAGNOSIS — S46802A Unspecified injury of other muscles, fascia and tendons at shoulder and upper arm level, left arm, initial encounter: Secondary | ICD-10-CM | POA: Diagnosis not present

## 2018-10-08 DIAGNOSIS — S46802A Unspecified injury of other muscles, fascia and tendons at shoulder and upper arm level, left arm, initial encounter: Secondary | ICD-10-CM | POA: Diagnosis not present

## 2018-10-11 DIAGNOSIS — S46802A Unspecified injury of other muscles, fascia and tendons at shoulder and upper arm level, left arm, initial encounter: Secondary | ICD-10-CM | POA: Diagnosis not present

## 2018-10-15 DIAGNOSIS — S46802A Unspecified injury of other muscles, fascia and tendons at shoulder and upper arm level, left arm, initial encounter: Secondary | ICD-10-CM | POA: Diagnosis not present

## 2018-10-18 DIAGNOSIS — S46802A Unspecified injury of other muscles, fascia and tendons at shoulder and upper arm level, left arm, initial encounter: Secondary | ICD-10-CM | POA: Diagnosis not present

## 2018-10-22 DIAGNOSIS — S46802A Unspecified injury of other muscles, fascia and tendons at shoulder and upper arm level, left arm, initial encounter: Secondary | ICD-10-CM | POA: Diagnosis not present

## 2018-10-27 DIAGNOSIS — M5412 Radiculopathy, cervical region: Secondary | ICD-10-CM | POA: Diagnosis not present

## 2018-10-29 DIAGNOSIS — M25511 Pain in right shoulder: Secondary | ICD-10-CM | POA: Diagnosis not present

## 2018-10-29 DIAGNOSIS — M50222 Other cervical disc displacement at C5-C6 level: Secondary | ICD-10-CM | POA: Diagnosis not present

## 2018-10-29 DIAGNOSIS — M4312 Spondylolisthesis, cervical region: Secondary | ICD-10-CM | POA: Diagnosis not present

## 2018-10-29 DIAGNOSIS — R221 Localized swelling, mass and lump, neck: Secondary | ICD-10-CM | POA: Diagnosis not present

## 2018-10-29 DIAGNOSIS — K219 Gastro-esophageal reflux disease without esophagitis: Secondary | ICD-10-CM | POA: Diagnosis not present

## 2018-10-29 DIAGNOSIS — M542 Cervicalgia: Secondary | ICD-10-CM | POA: Diagnosis not present

## 2018-10-29 DIAGNOSIS — M19012 Primary osteoarthritis, left shoulder: Secondary | ICD-10-CM | POA: Diagnosis not present

## 2018-10-29 DIAGNOSIS — M4310 Spondylolisthesis, site unspecified: Secondary | ICD-10-CM | POA: Diagnosis not present

## 2018-10-29 DIAGNOSIS — M79601 Pain in right arm: Secondary | ICD-10-CM | POA: Diagnosis not present

## 2018-10-29 DIAGNOSIS — M62838 Other muscle spasm: Secondary | ICD-10-CM | POA: Diagnosis not present

## 2018-10-29 DIAGNOSIS — G4733 Obstructive sleep apnea (adult) (pediatric): Secondary | ICD-10-CM | POA: Diagnosis not present

## 2018-11-02 DIAGNOSIS — M5412 Radiculopathy, cervical region: Secondary | ICD-10-CM | POA: Diagnosis not present

## 2018-11-09 DIAGNOSIS — B370 Candidal stomatitis: Secondary | ICD-10-CM | POA: Diagnosis not present

## 2018-11-09 DIAGNOSIS — M5412 Radiculopathy, cervical region: Secondary | ICD-10-CM | POA: Diagnosis not present

## 2018-11-16 DIAGNOSIS — G4733 Obstructive sleep apnea (adult) (pediatric): Secondary | ICD-10-CM | POA: Diagnosis not present

## 2018-11-19 DIAGNOSIS — M47812 Spondylosis without myelopathy or radiculopathy, cervical region: Secondary | ICD-10-CM | POA: Diagnosis not present

## 2018-11-19 DIAGNOSIS — M4802 Spinal stenosis, cervical region: Secondary | ICD-10-CM | POA: Diagnosis not present

## 2018-11-19 DIAGNOSIS — M50221 Other cervical disc displacement at C4-C5 level: Secondary | ICD-10-CM | POA: Diagnosis not present

## 2018-11-19 DIAGNOSIS — M5030 Other cervical disc degeneration, unspecified cervical region: Secondary | ICD-10-CM | POA: Diagnosis not present

## 2018-11-19 DIAGNOSIS — M50323 Other cervical disc degeneration at C6-C7 level: Secondary | ICD-10-CM | POA: Diagnosis not present

## 2018-11-19 DIAGNOSIS — M4310 Spondylolisthesis, site unspecified: Secondary | ICD-10-CM | POA: Diagnosis not present

## 2018-11-19 DIAGNOSIS — M5021 Other cervical disc displacement,  high cervical region: Secondary | ICD-10-CM | POA: Diagnosis not present

## 2018-11-19 IMAGING — CT CT ANKLE*R* W/O CM
4 of 6 series · 12 of 33 positions shown, 14 images · non-contrast
Comparison: None.

CLINICAL DATA: Injured June 2017. Nondisplaced fracture of the
distal fibula diagnosed on 07/04/2017.

EXAM:
CT OF THE RIGHT ANKLE WITHOUT CONTRAST
TECHNIQUE: Multidetector CT imaging of the right ankle was performed according
to the standard protocol. Multiplanar CT image reconstructions were
also generated.

[Series 3: sfov lower extremity 2.00 br60 s3 ax · axial · 0.21mm/px · z∈[+580,+696]mm · 5 of 88 slices shown, 7 images]
[im 15/88  soft-tissue]
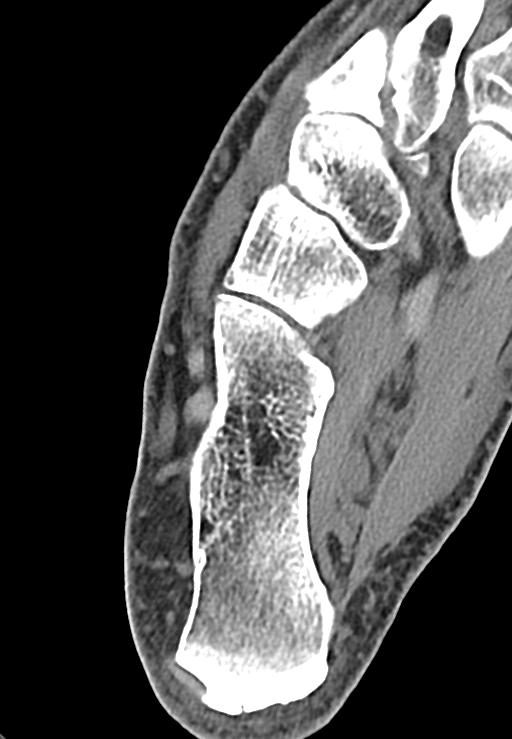
[im 15/88  bone]
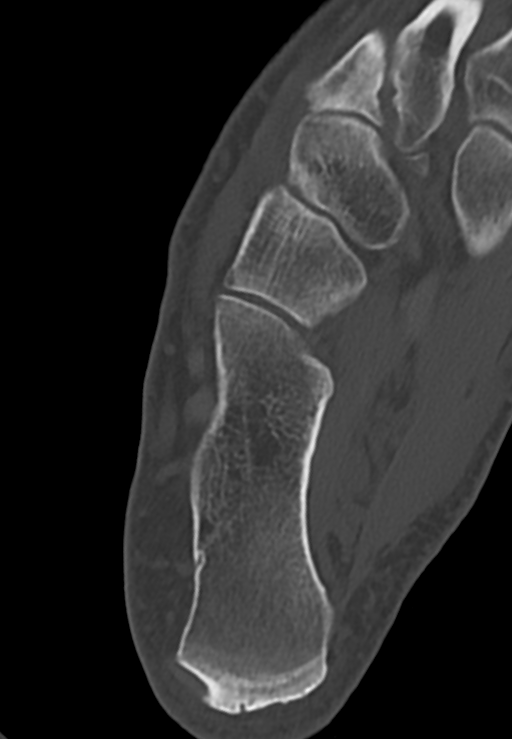
[im 30/88  bone]
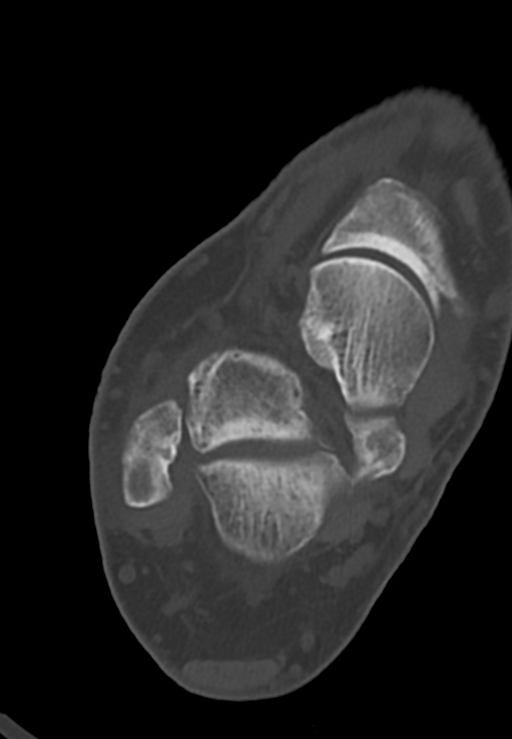
[im 44/88  bone]
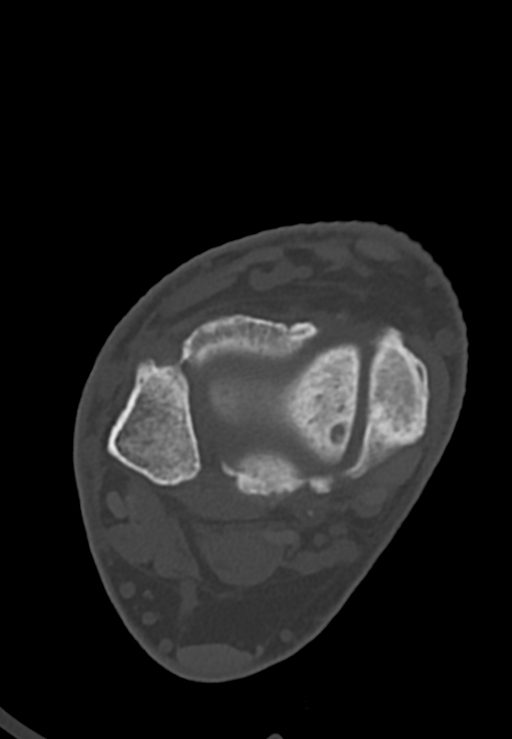
[im 59/88  bone]
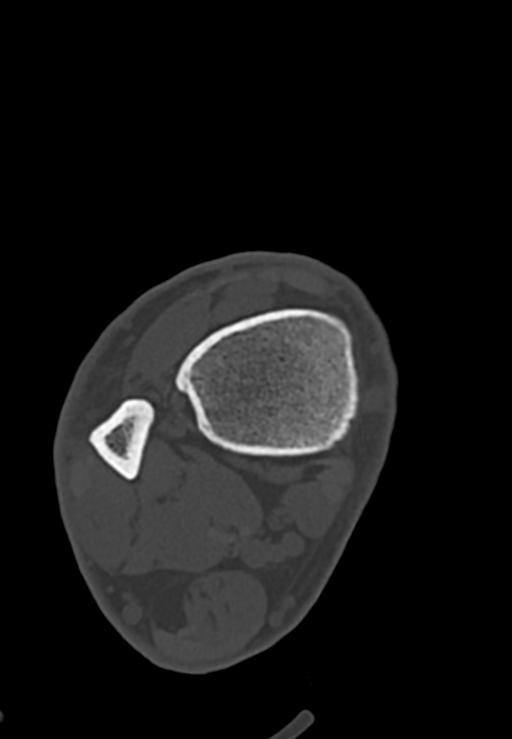
[im 73/88  soft-tissue]
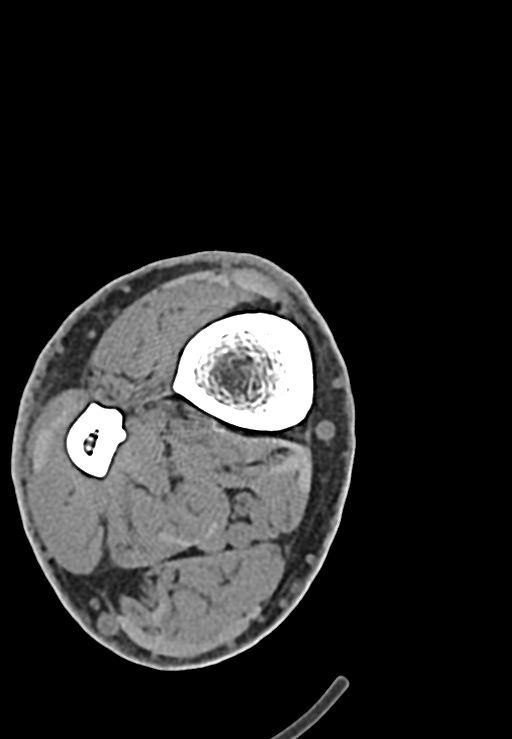
[im 73/88  bone]
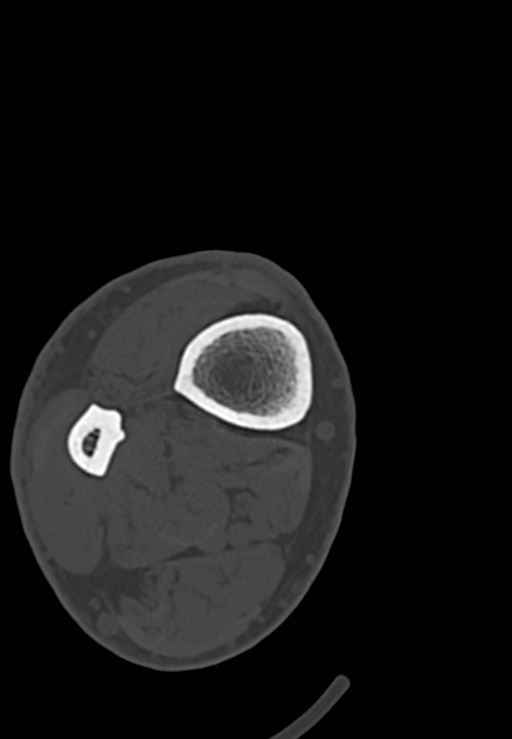

[Series 5: sfov lower extremity 2.00 br60 s3 cor · coronal · 0.21mm/px · 1 of 78 slices shown]
[im 39/78  bone]
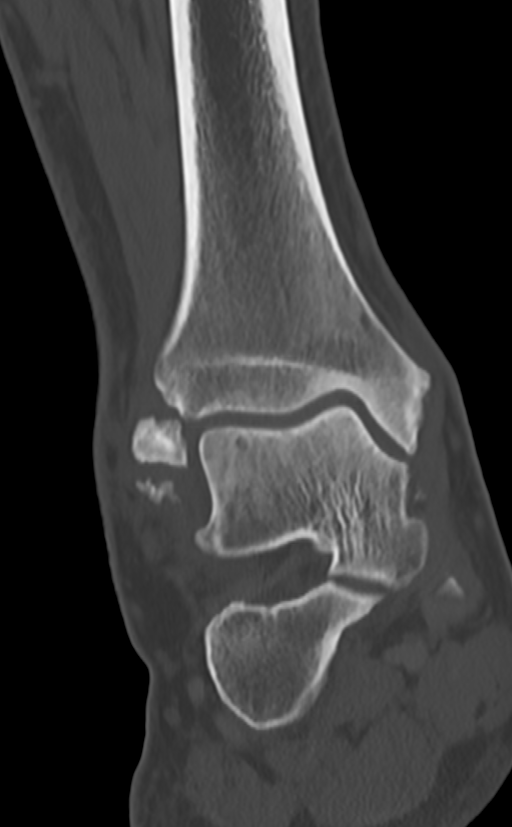

[Series 7: sfov lower extremity 2.00 br60 s3 sag · sagittal · 0.31mm/px · 5 of 54 slices shown]
[im 9/54  bone]
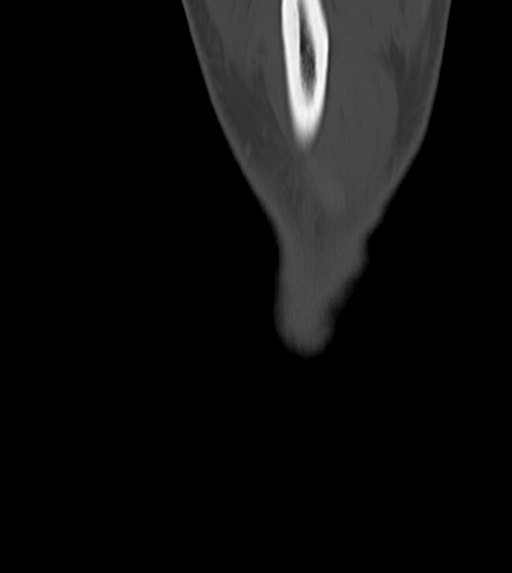
[im 18/54  bone]
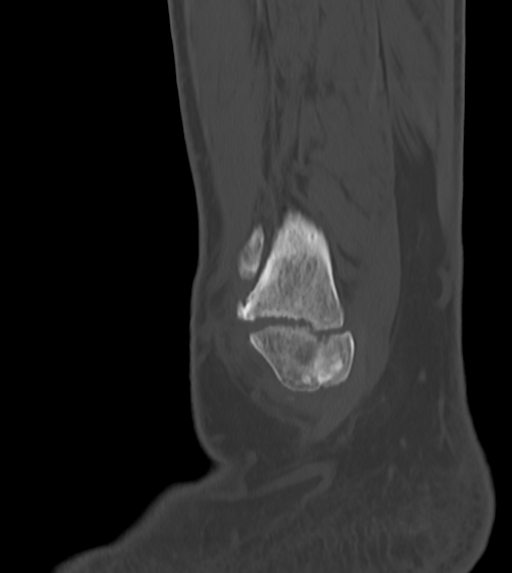
[im 27/54  bone]
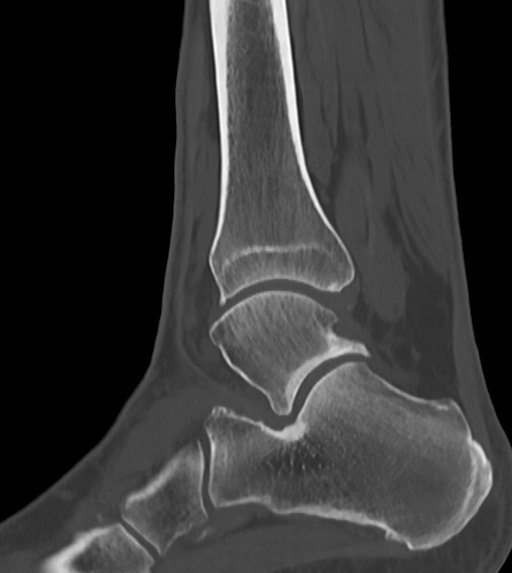
[im 36/54  bone]
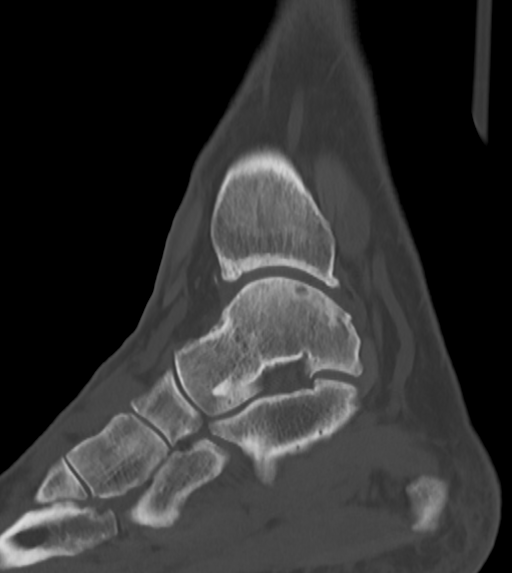
[im 45/54  bone]
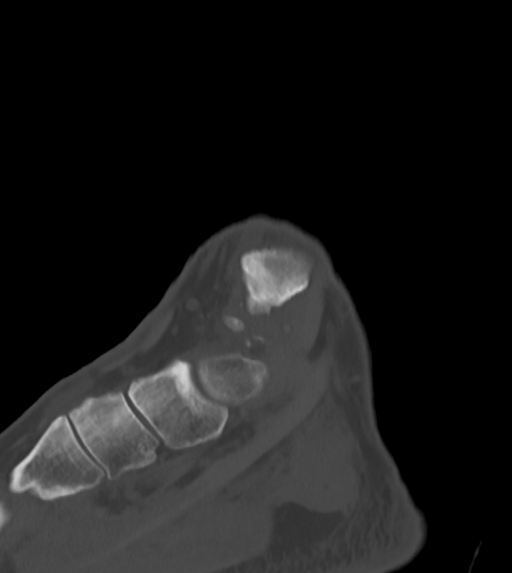

[Series 9: sfov lower extremity 2.00 br40 s3 ax · axial · 0.21mm/px · 1 of 88 slices shown]
[im 15/88  bone]
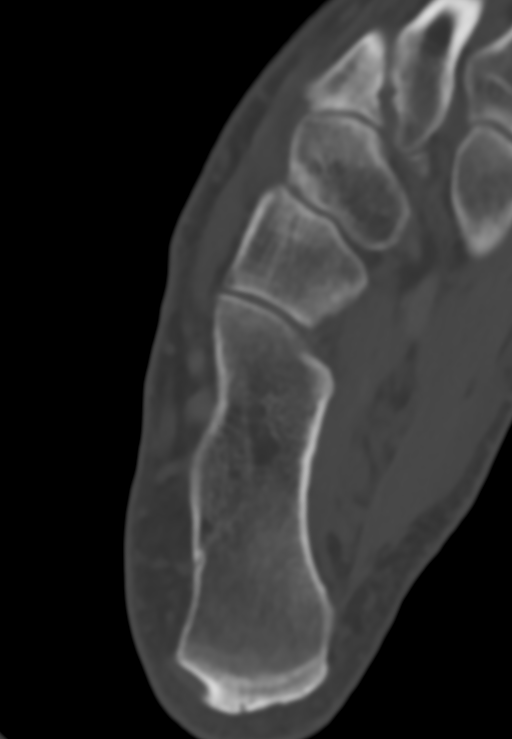

[12 of 33 positions shown; findings below may reference images not displayed]

FINDINGS: Bones/Joint/Cartilage

Ununited transverse fracture of the lateral malleolus with 5 mm of
distraction and sclerosis along the fracture margin. No significant
callus formation.

No other acute fracture or dislocation. Small bony fragments
adjacent to the medial malleolus and talus consistent with sequela
prior avulsive injury. Subchondral cystic changes involving the
medial corner of the talar dome.

Ankle mortise is intact. Mild osteoarthritis of the tibiotalar
joint. No aggressive osseous lesion. Small joint effusion.

Ligaments

Suboptimally assessed by CT.

Muscles and Tendons

Muscles are normal. Flexor, extensor, peroneal and Achilles tendons
are grossly intact.

Soft tissues

No fluid collection or hematoma.
IMPRESSION: 1. Nonunion of a transverse fracture of the lateral malleolus with 5
mm of persistent distraction and sclerosis along the fracture margin
without callus formation.

## 2018-12-06 DIAGNOSIS — Z6839 Body mass index (BMI) 39.0-39.9, adult: Secondary | ICD-10-CM | POA: Diagnosis not present

## 2018-12-06 DIAGNOSIS — M47812 Spondylosis without myelopathy or radiculopathy, cervical region: Secondary | ICD-10-CM | POA: Diagnosis not present

## 2018-12-06 DIAGNOSIS — M5020 Other cervical disc displacement, unspecified cervical region: Secondary | ICD-10-CM | POA: Diagnosis not present

## 2018-12-06 DIAGNOSIS — M5412 Radiculopathy, cervical region: Secondary | ICD-10-CM | POA: Diagnosis not present

## 2018-12-16 DIAGNOSIS — M5412 Radiculopathy, cervical region: Secondary | ICD-10-CM | POA: Diagnosis not present

## 2018-12-21 DIAGNOSIS — M5412 Radiculopathy, cervical region: Secondary | ICD-10-CM | POA: Diagnosis not present

## 2018-12-21 DIAGNOSIS — M47812 Spondylosis without myelopathy or radiculopathy, cervical region: Secondary | ICD-10-CM | POA: Diagnosis not present

## 2018-12-21 DIAGNOSIS — M5020 Other cervical disc displacement, unspecified cervical region: Secondary | ICD-10-CM | POA: Diagnosis not present

## 2018-12-23 DIAGNOSIS — M47812 Spondylosis without myelopathy or radiculopathy, cervical region: Secondary | ICD-10-CM | POA: Diagnosis not present

## 2018-12-23 DIAGNOSIS — M5412 Radiculopathy, cervical region: Secondary | ICD-10-CM | POA: Diagnosis not present

## 2018-12-30 DIAGNOSIS — M5412 Radiculopathy, cervical region: Secondary | ICD-10-CM | POA: Diagnosis not present

## 2018-12-30 DIAGNOSIS — M47812 Spondylosis without myelopathy or radiculopathy, cervical region: Secondary | ICD-10-CM | POA: Diagnosis not present

## 2019-01-04 DIAGNOSIS — M25511 Pain in right shoulder: Secondary | ICD-10-CM | POA: Diagnosis not present

## 2019-01-04 DIAGNOSIS — M47812 Spondylosis without myelopathy or radiculopathy, cervical region: Secondary | ICD-10-CM | POA: Diagnosis not present

## 2019-01-04 DIAGNOSIS — M5412 Radiculopathy, cervical region: Secondary | ICD-10-CM | POA: Diagnosis not present

## 2019-01-04 DIAGNOSIS — M5020 Other cervical disc displacement, unspecified cervical region: Secondary | ICD-10-CM | POA: Diagnosis not present

## 2019-01-06 DIAGNOSIS — M5412 Radiculopathy, cervical region: Secondary | ICD-10-CM | POA: Diagnosis not present

## 2019-01-13 DIAGNOSIS — M25511 Pain in right shoulder: Secondary | ICD-10-CM | POA: Diagnosis not present

## 2019-01-13 DIAGNOSIS — M7581 Other shoulder lesions, right shoulder: Secondary | ICD-10-CM | POA: Diagnosis not present

## 2019-01-13 DIAGNOSIS — G8929 Other chronic pain: Secondary | ICD-10-CM | POA: Diagnosis not present

## 2019-01-13 DIAGNOSIS — R531 Weakness: Secondary | ICD-10-CM | POA: Diagnosis not present

## 2019-01-13 DIAGNOSIS — M12811 Other specific arthropathies, not elsewhere classified, right shoulder: Secondary | ICD-10-CM | POA: Diagnosis not present

## 2019-01-19 DIAGNOSIS — M5412 Radiculopathy, cervical region: Secondary | ICD-10-CM | POA: Diagnosis not present

## 2019-01-19 DIAGNOSIS — M7581 Other shoulder lesions, right shoulder: Secondary | ICD-10-CM | POA: Diagnosis not present

## 2019-01-19 DIAGNOSIS — M47812 Spondylosis without myelopathy or radiculopathy, cervical region: Secondary | ICD-10-CM | POA: Diagnosis not present

## 2019-01-19 DIAGNOSIS — Z6839 Body mass index (BMI) 39.0-39.9, adult: Secondary | ICD-10-CM | POA: Diagnosis not present

## 2019-01-25 DIAGNOSIS — S46802A Unspecified injury of other muscles, fascia and tendons at shoulder and upper arm level, left arm, initial encounter: Secondary | ICD-10-CM | POA: Diagnosis not present

## 2019-02-01 DIAGNOSIS — S46802A Unspecified injury of other muscles, fascia and tendons at shoulder and upper arm level, left arm, initial encounter: Secondary | ICD-10-CM | POA: Diagnosis not present

## 2019-02-10 DIAGNOSIS — E785 Hyperlipidemia, unspecified: Secondary | ICD-10-CM | POA: Diagnosis not present

## 2019-02-10 DIAGNOSIS — M5412 Radiculopathy, cervical region: Secondary | ICD-10-CM | POA: Diagnosis not present

## 2019-02-10 DIAGNOSIS — R7303 Prediabetes: Secondary | ICD-10-CM | POA: Diagnosis not present

## 2019-02-10 DIAGNOSIS — R739 Hyperglycemia, unspecified: Secondary | ICD-10-CM | POA: Diagnosis not present

## 2019-02-10 DIAGNOSIS — R61 Generalized hyperhidrosis: Secondary | ICD-10-CM | POA: Diagnosis not present

## 2019-02-14 DIAGNOSIS — G4733 Obstructive sleep apnea (adult) (pediatric): Secondary | ICD-10-CM | POA: Diagnosis not present

## 2019-02-14 DIAGNOSIS — S46802A Unspecified injury of other muscles, fascia and tendons at shoulder and upper arm level, left arm, initial encounter: Secondary | ICD-10-CM | POA: Diagnosis not present

## 2019-07-26 DIAGNOSIS — Z20828 Contact with and (suspected) exposure to other viral communicable diseases: Secondary | ICD-10-CM | POA: Diagnosis not present

## 2019-07-26 DIAGNOSIS — M791 Myalgia, unspecified site: Secondary | ICD-10-CM | POA: Diagnosis not present

## 2019-07-26 DIAGNOSIS — K591 Functional diarrhea: Secondary | ICD-10-CM | POA: Diagnosis not present

## 2019-08-06 ENCOUNTER — Ambulatory Visit: Payer: BLUE CROSS/BLUE SHIELD

## 2019-09-13 DIAGNOSIS — K760 Fatty (change of) liver, not elsewhere classified: Secondary | ICD-10-CM | POA: Diagnosis not present

## 2019-09-13 DIAGNOSIS — E119 Type 2 diabetes mellitus without complications: Secondary | ICD-10-CM | POA: Diagnosis not present

## 2019-09-13 DIAGNOSIS — R5383 Other fatigue: Secondary | ICD-10-CM | POA: Diagnosis not present

## 2019-09-13 DIAGNOSIS — Z1159 Encounter for screening for other viral diseases: Secondary | ICD-10-CM | POA: Diagnosis not present

## 2019-10-07 DIAGNOSIS — S76311A Strain of muscle, fascia and tendon of the posterior muscle group at thigh level, right thigh, initial encounter: Secondary | ICD-10-CM | POA: Diagnosis not present

## 2019-10-07 DIAGNOSIS — Z6835 Body mass index (BMI) 35.0-35.9, adult: Secondary | ICD-10-CM | POA: Diagnosis not present

## 2019-10-07 DIAGNOSIS — E1169 Type 2 diabetes mellitus with other specified complication: Secondary | ICD-10-CM | POA: Diagnosis not present

## 2019-10-07 DIAGNOSIS — E785 Hyperlipidemia, unspecified: Secondary | ICD-10-CM | POA: Diagnosis not present

## 2019-12-05 DIAGNOSIS — M9903 Segmental and somatic dysfunction of lumbar region: Secondary | ICD-10-CM | POA: Diagnosis not present

## 2019-12-05 DIAGNOSIS — M9905 Segmental and somatic dysfunction of pelvic region: Secondary | ICD-10-CM | POA: Diagnosis not present

## 2019-12-05 DIAGNOSIS — M5416 Radiculopathy, lumbar region: Secondary | ICD-10-CM | POA: Diagnosis not present

## 2019-12-05 DIAGNOSIS — M9902 Segmental and somatic dysfunction of thoracic region: Secondary | ICD-10-CM | POA: Diagnosis not present

## 2019-12-08 DIAGNOSIS — M9902 Segmental and somatic dysfunction of thoracic region: Secondary | ICD-10-CM | POA: Diagnosis not present

## 2019-12-08 DIAGNOSIS — M5416 Radiculopathy, lumbar region: Secondary | ICD-10-CM | POA: Diagnosis not present

## 2019-12-08 DIAGNOSIS — M9905 Segmental and somatic dysfunction of pelvic region: Secondary | ICD-10-CM | POA: Diagnosis not present

## 2019-12-08 DIAGNOSIS — M9903 Segmental and somatic dysfunction of lumbar region: Secondary | ICD-10-CM | POA: Diagnosis not present

## 2019-12-14 DIAGNOSIS — E785 Hyperlipidemia, unspecified: Secondary | ICD-10-CM | POA: Diagnosis not present

## 2019-12-14 DIAGNOSIS — E1169 Type 2 diabetes mellitus with other specified complication: Secondary | ICD-10-CM | POA: Diagnosis not present

## 2019-12-14 DIAGNOSIS — M5441 Lumbago with sciatica, right side: Secondary | ICD-10-CM | POA: Diagnosis not present

## 2019-12-14 DIAGNOSIS — G8929 Other chronic pain: Secondary | ICD-10-CM | POA: Diagnosis not present

## 2020-01-03 DIAGNOSIS — G4733 Obstructive sleep apnea (adult) (pediatric): Secondary | ICD-10-CM | POA: Diagnosis not present

## 2020-01-12 DIAGNOSIS — M5441 Lumbago with sciatica, right side: Secondary | ICD-10-CM | POA: Diagnosis not present

## 2020-01-12 DIAGNOSIS — M545 Low back pain: Secondary | ICD-10-CM | POA: Diagnosis not present

## 2020-03-05 DIAGNOSIS — E119 Type 2 diabetes mellitus without complications: Secondary | ICD-10-CM | POA: Diagnosis not present

## 2020-03-27 DIAGNOSIS — H26491 Other secondary cataract, right eye: Secondary | ICD-10-CM | POA: Diagnosis not present

## 2020-04-19 DIAGNOSIS — E785 Hyperlipidemia, unspecified: Secondary | ICD-10-CM | POA: Diagnosis not present

## 2020-04-19 DIAGNOSIS — Z23 Encounter for immunization: Secondary | ICD-10-CM | POA: Diagnosis not present

## 2020-04-19 DIAGNOSIS — Z6837 Body mass index (BMI) 37.0-37.9, adult: Secondary | ICD-10-CM | POA: Diagnosis not present

## 2020-04-19 DIAGNOSIS — E1169 Type 2 diabetes mellitus with other specified complication: Secondary | ICD-10-CM | POA: Diagnosis not present

## 2020-05-22 DIAGNOSIS — J440 Chronic obstructive pulmonary disease with acute lower respiratory infection: Secondary | ICD-10-CM | POA: Diagnosis not present

## 2020-05-22 DIAGNOSIS — R051 Acute cough: Secondary | ICD-10-CM | POA: Diagnosis not present

## 2020-05-22 DIAGNOSIS — R059 Cough, unspecified: Secondary | ICD-10-CM | POA: Diagnosis not present

## 2020-05-22 DIAGNOSIS — Z20828 Contact with and (suspected) exposure to other viral communicable diseases: Secondary | ICD-10-CM | POA: Diagnosis not present

## 2020-06-01 DIAGNOSIS — H26491 Other secondary cataract, right eye: Secondary | ICD-10-CM | POA: Diagnosis not present

## 2020-07-07 DIAGNOSIS — Z20828 Contact with and (suspected) exposure to other viral communicable diseases: Secondary | ICD-10-CM | POA: Diagnosis not present

## 2020-07-07 DIAGNOSIS — R438 Other disturbances of smell and taste: Secondary | ICD-10-CM | POA: Diagnosis not present

## 2020-08-31 DIAGNOSIS — J329 Chronic sinusitis, unspecified: Secondary | ICD-10-CM | POA: Diagnosis not present

## 2020-08-31 DIAGNOSIS — Z6836 Body mass index (BMI) 36.0-36.9, adult: Secondary | ICD-10-CM | POA: Diagnosis not present

## 2020-08-31 DIAGNOSIS — J40 Bronchitis, not specified as acute or chronic: Secondary | ICD-10-CM | POA: Diagnosis not present

## 2021-01-21 DIAGNOSIS — Z20828 Contact with and (suspected) exposure to other viral communicable diseases: Secondary | ICD-10-CM | POA: Diagnosis not present

## 2021-01-21 DIAGNOSIS — J069 Acute upper respiratory infection, unspecified: Secondary | ICD-10-CM | POA: Diagnosis not present

## 2021-01-21 DIAGNOSIS — R509 Fever, unspecified: Secondary | ICD-10-CM | POA: Diagnosis not present

## 2021-02-22 DIAGNOSIS — E785 Hyperlipidemia, unspecified: Secondary | ICD-10-CM | POA: Diagnosis not present

## 2021-02-22 DIAGNOSIS — E1169 Type 2 diabetes mellitus with other specified complication: Secondary | ICD-10-CM | POA: Diagnosis not present

## 2021-02-22 DIAGNOSIS — Z125 Encounter for screening for malignant neoplasm of prostate: Secondary | ICD-10-CM | POA: Diagnosis not present

## 2021-02-22 DIAGNOSIS — K219 Gastro-esophageal reflux disease without esophagitis: Secondary | ICD-10-CM | POA: Diagnosis not present

## 2021-02-22 DIAGNOSIS — Z6836 Body mass index (BMI) 36.0-36.9, adult: Secondary | ICD-10-CM | POA: Diagnosis not present

## 2021-02-27 DIAGNOSIS — G4733 Obstructive sleep apnea (adult) (pediatric): Secondary | ICD-10-CM | POA: Diagnosis not present

## 2021-04-12 DIAGNOSIS — M2550 Pain in unspecified joint: Secondary | ICD-10-CM | POA: Diagnosis not present

## 2021-04-12 DIAGNOSIS — Z0000 Encounter for general adult medical examination without abnormal findings: Secondary | ICD-10-CM | POA: Diagnosis not present

## 2021-04-12 DIAGNOSIS — E119 Type 2 diabetes mellitus without complications: Secondary | ICD-10-CM | POA: Diagnosis not present

## 2021-04-12 DIAGNOSIS — Z1389 Encounter for screening for other disorder: Secondary | ICD-10-CM | POA: Diagnosis not present

## 2021-04-12 DIAGNOSIS — J0110 Acute frontal sinusitis, unspecified: Secondary | ICD-10-CM | POA: Diagnosis not present

## 2021-04-15 DIAGNOSIS — Z6837 Body mass index (BMI) 37.0-37.9, adult: Secondary | ICD-10-CM | POA: Diagnosis not present

## 2021-04-15 DIAGNOSIS — G4733 Obstructive sleep apnea (adult) (pediatric): Secondary | ICD-10-CM | POA: Diagnosis not present

## 2021-04-15 DIAGNOSIS — Z9989 Dependence on other enabling machines and devices: Secondary | ICD-10-CM | POA: Diagnosis not present

## 2021-04-15 DIAGNOSIS — E669 Obesity, unspecified: Secondary | ICD-10-CM | POA: Diagnosis not present

## 2021-04-15 DIAGNOSIS — L723 Sebaceous cyst: Secondary | ICD-10-CM | POA: Diagnosis not present

## 2021-04-15 DIAGNOSIS — L089 Local infection of the skin and subcutaneous tissue, unspecified: Secondary | ICD-10-CM | POA: Diagnosis not present

## 2021-05-01 DIAGNOSIS — L720 Epidermal cyst: Secondary | ICD-10-CM | POA: Diagnosis not present

## 2021-05-25 DIAGNOSIS — J019 Acute sinusitis, unspecified: Secondary | ICD-10-CM | POA: Diagnosis not present

## 2021-06-07 DIAGNOSIS — L308 Other specified dermatitis: Secondary | ICD-10-CM | POA: Diagnosis not present

## 2021-06-07 DIAGNOSIS — D485 Neoplasm of uncertain behavior of skin: Secondary | ICD-10-CM | POA: Diagnosis not present

## 2021-06-07 DIAGNOSIS — L538 Other specified erythematous conditions: Secondary | ICD-10-CM | POA: Diagnosis not present

## 2021-08-12 DIAGNOSIS — J205 Acute bronchitis due to respiratory syncytial virus: Secondary | ICD-10-CM | POA: Diagnosis not present

## 2021-08-12 DIAGNOSIS — E119 Type 2 diabetes mellitus without complications: Secondary | ICD-10-CM | POA: Diagnosis not present

## 2021-08-12 DIAGNOSIS — Z0000 Encounter for general adult medical examination without abnormal findings: Secondary | ICD-10-CM | POA: Diagnosis not present

## 2021-08-17 DIAGNOSIS — J209 Acute bronchitis, unspecified: Secondary | ICD-10-CM | POA: Diagnosis not present

## 2021-08-17 DIAGNOSIS — R051 Acute cough: Secondary | ICD-10-CM | POA: Diagnosis not present

## 2021-10-11 DIAGNOSIS — Z0000 Encounter for general adult medical examination without abnormal findings: Secondary | ICD-10-CM | POA: Diagnosis not present

## 2021-10-11 DIAGNOSIS — E559 Vitamin D deficiency, unspecified: Secondary | ICD-10-CM | POA: Diagnosis not present

## 2021-10-11 DIAGNOSIS — Z8042 Family history of malignant neoplasm of prostate: Secondary | ICD-10-CM | POA: Diagnosis not present

## 2021-10-11 DIAGNOSIS — E785 Hyperlipidemia, unspecified: Secondary | ICD-10-CM | POA: Diagnosis not present

## 2021-10-11 DIAGNOSIS — E119 Type 2 diabetes mellitus without complications: Secondary | ICD-10-CM | POA: Diagnosis not present

## 2021-10-19 DIAGNOSIS — H6692 Otitis media, unspecified, left ear: Secondary | ICD-10-CM | POA: Diagnosis not present

## 2021-10-19 DIAGNOSIS — H6092 Unspecified otitis externa, left ear: Secondary | ICD-10-CM | POA: Diagnosis not present

## 2021-11-01 DIAGNOSIS — Z1211 Encounter for screening for malignant neoplasm of colon: Secondary | ICD-10-CM | POA: Diagnosis not present

## 2021-11-01 DIAGNOSIS — K5730 Diverticulosis of large intestine without perforation or abscess without bleeding: Secondary | ICD-10-CM | POA: Diagnosis not present

## 2021-11-11 DIAGNOSIS — M5441 Lumbago with sciatica, right side: Secondary | ICD-10-CM | POA: Diagnosis not present

## 2021-11-11 DIAGNOSIS — M9905 Segmental and somatic dysfunction of pelvic region: Secondary | ICD-10-CM | POA: Diagnosis not present

## 2021-11-11 DIAGNOSIS — M5126 Other intervertebral disc displacement, lumbar region: Secondary | ICD-10-CM | POA: Diagnosis not present

## 2021-11-11 DIAGNOSIS — M9903 Segmental and somatic dysfunction of lumbar region: Secondary | ICD-10-CM | POA: Diagnosis not present

## 2021-11-12 DIAGNOSIS — M5441 Lumbago with sciatica, right side: Secondary | ICD-10-CM | POA: Diagnosis not present

## 2021-11-12 DIAGNOSIS — M9903 Segmental and somatic dysfunction of lumbar region: Secondary | ICD-10-CM | POA: Diagnosis not present

## 2021-11-12 DIAGNOSIS — M9905 Segmental and somatic dysfunction of pelvic region: Secondary | ICD-10-CM | POA: Diagnosis not present

## 2021-11-12 DIAGNOSIS — M5126 Other intervertebral disc displacement, lumbar region: Secondary | ICD-10-CM | POA: Diagnosis not present

## 2021-11-13 DIAGNOSIS — M5441 Lumbago with sciatica, right side: Secondary | ICD-10-CM | POA: Diagnosis not present

## 2021-11-13 DIAGNOSIS — M9903 Segmental and somatic dysfunction of lumbar region: Secondary | ICD-10-CM | POA: Diagnosis not present

## 2021-11-13 DIAGNOSIS — M9905 Segmental and somatic dysfunction of pelvic region: Secondary | ICD-10-CM | POA: Diagnosis not present

## 2021-11-13 DIAGNOSIS — M5126 Other intervertebral disc displacement, lumbar region: Secondary | ICD-10-CM | POA: Diagnosis not present

## 2021-11-15 DIAGNOSIS — M5441 Lumbago with sciatica, right side: Secondary | ICD-10-CM | POA: Diagnosis not present

## 2021-11-15 DIAGNOSIS — M5126 Other intervertebral disc displacement, lumbar region: Secondary | ICD-10-CM | POA: Diagnosis not present

## 2021-11-15 DIAGNOSIS — M9903 Segmental and somatic dysfunction of lumbar region: Secondary | ICD-10-CM | POA: Diagnosis not present

## 2021-11-15 DIAGNOSIS — M9905 Segmental and somatic dysfunction of pelvic region: Secondary | ICD-10-CM | POA: Diagnosis not present

## 2021-11-19 DIAGNOSIS — M9905 Segmental and somatic dysfunction of pelvic region: Secondary | ICD-10-CM | POA: Diagnosis not present

## 2021-11-19 DIAGNOSIS — M5441 Lumbago with sciatica, right side: Secondary | ICD-10-CM | POA: Diagnosis not present

## 2021-11-19 DIAGNOSIS — M5126 Other intervertebral disc displacement, lumbar region: Secondary | ICD-10-CM | POA: Diagnosis not present

## 2021-11-19 DIAGNOSIS — M9903 Segmental and somatic dysfunction of lumbar region: Secondary | ICD-10-CM | POA: Diagnosis not present

## 2021-11-20 DIAGNOSIS — M9905 Segmental and somatic dysfunction of pelvic region: Secondary | ICD-10-CM | POA: Diagnosis not present

## 2021-11-20 DIAGNOSIS — M9903 Segmental and somatic dysfunction of lumbar region: Secondary | ICD-10-CM | POA: Diagnosis not present

## 2021-11-20 DIAGNOSIS — M5010 Cervical disc disorder with radiculopathy, unspecified cervical region: Secondary | ICD-10-CM | POA: Diagnosis not present

## 2021-11-20 DIAGNOSIS — M5441 Lumbago with sciatica, right side: Secondary | ICD-10-CM | POA: Diagnosis not present

## 2021-11-20 DIAGNOSIS — M5126 Other intervertebral disc displacement, lumbar region: Secondary | ICD-10-CM | POA: Diagnosis not present

## 2021-11-22 DIAGNOSIS — M5441 Lumbago with sciatica, right side: Secondary | ICD-10-CM | POA: Diagnosis not present

## 2021-11-22 DIAGNOSIS — M9905 Segmental and somatic dysfunction of pelvic region: Secondary | ICD-10-CM | POA: Diagnosis not present

## 2021-11-22 DIAGNOSIS — M9903 Segmental and somatic dysfunction of lumbar region: Secondary | ICD-10-CM | POA: Diagnosis not present

## 2021-11-22 DIAGNOSIS — M5126 Other intervertebral disc displacement, lumbar region: Secondary | ICD-10-CM | POA: Diagnosis not present

## 2021-11-25 DIAGNOSIS — M9905 Segmental and somatic dysfunction of pelvic region: Secondary | ICD-10-CM | POA: Diagnosis not present

## 2021-11-25 DIAGNOSIS — M9903 Segmental and somatic dysfunction of lumbar region: Secondary | ICD-10-CM | POA: Diagnosis not present

## 2021-11-25 DIAGNOSIS — M5441 Lumbago with sciatica, right side: Secondary | ICD-10-CM | POA: Diagnosis not present

## 2021-11-25 DIAGNOSIS — M5126 Other intervertebral disc displacement, lumbar region: Secondary | ICD-10-CM | POA: Diagnosis not present

## 2021-11-27 DIAGNOSIS — M9905 Segmental and somatic dysfunction of pelvic region: Secondary | ICD-10-CM | POA: Diagnosis not present

## 2021-11-27 DIAGNOSIS — M5441 Lumbago with sciatica, right side: Secondary | ICD-10-CM | POA: Diagnosis not present

## 2021-11-27 DIAGNOSIS — M5126 Other intervertebral disc displacement, lumbar region: Secondary | ICD-10-CM | POA: Diagnosis not present

## 2021-11-27 DIAGNOSIS — M9903 Segmental and somatic dysfunction of lumbar region: Secondary | ICD-10-CM | POA: Diagnosis not present

## 2021-11-28 DIAGNOSIS — Z1389 Encounter for screening for other disorder: Secondary | ICD-10-CM | POA: Diagnosis not present

## 2021-11-28 DIAGNOSIS — M5416 Radiculopathy, lumbar region: Secondary | ICD-10-CM | POA: Diagnosis not present

## 2021-11-28 DIAGNOSIS — E119 Type 2 diabetes mellitus without complications: Secondary | ICD-10-CM | POA: Diagnosis not present

## 2021-11-28 DIAGNOSIS — M5441 Lumbago with sciatica, right side: Secondary | ICD-10-CM | POA: Diagnosis not present

## 2021-11-28 DIAGNOSIS — Z712 Person consulting for explanation of examination or test findings: Secondary | ICD-10-CM | POA: Diagnosis not present

## 2021-11-29 DIAGNOSIS — M9905 Segmental and somatic dysfunction of pelvic region: Secondary | ICD-10-CM | POA: Diagnosis not present

## 2021-11-29 DIAGNOSIS — M5126 Other intervertebral disc displacement, lumbar region: Secondary | ICD-10-CM | POA: Diagnosis not present

## 2021-11-29 DIAGNOSIS — M5441 Lumbago with sciatica, right side: Secondary | ICD-10-CM | POA: Diagnosis not present

## 2021-11-29 DIAGNOSIS — M9903 Segmental and somatic dysfunction of lumbar region: Secondary | ICD-10-CM | POA: Diagnosis not present

## 2021-12-02 DIAGNOSIS — M5441 Lumbago with sciatica, right side: Secondary | ICD-10-CM | POA: Diagnosis not present

## 2021-12-02 DIAGNOSIS — M9903 Segmental and somatic dysfunction of lumbar region: Secondary | ICD-10-CM | POA: Diagnosis not present

## 2021-12-02 DIAGNOSIS — M5126 Other intervertebral disc displacement, lumbar region: Secondary | ICD-10-CM | POA: Diagnosis not present

## 2021-12-02 DIAGNOSIS — M9905 Segmental and somatic dysfunction of pelvic region: Secondary | ICD-10-CM | POA: Diagnosis not present

## 2021-12-04 DIAGNOSIS — M5126 Other intervertebral disc displacement, lumbar region: Secondary | ICD-10-CM | POA: Diagnosis not present

## 2021-12-04 DIAGNOSIS — M9903 Segmental and somatic dysfunction of lumbar region: Secondary | ICD-10-CM | POA: Diagnosis not present

## 2021-12-04 DIAGNOSIS — M9905 Segmental and somatic dysfunction of pelvic region: Secondary | ICD-10-CM | POA: Diagnosis not present

## 2021-12-04 DIAGNOSIS — D1809 Hemangioma of other sites: Secondary | ICD-10-CM | POA: Diagnosis not present

## 2021-12-04 DIAGNOSIS — M5441 Lumbago with sciatica, right side: Secondary | ICD-10-CM | POA: Diagnosis not present

## 2021-12-04 DIAGNOSIS — M5030 Other cervical disc degeneration, unspecified cervical region: Secondary | ICD-10-CM | POA: Diagnosis not present

## 2021-12-04 DIAGNOSIS — M48061 Spinal stenosis, lumbar region without neurogenic claudication: Secondary | ICD-10-CM | POA: Diagnosis not present

## 2021-12-06 DIAGNOSIS — M5126 Other intervertebral disc displacement, lumbar region: Secondary | ICD-10-CM | POA: Diagnosis not present

## 2021-12-06 DIAGNOSIS — M5441 Lumbago with sciatica, right side: Secondary | ICD-10-CM | POA: Diagnosis not present

## 2021-12-06 DIAGNOSIS — M9905 Segmental and somatic dysfunction of pelvic region: Secondary | ICD-10-CM | POA: Diagnosis not present

## 2021-12-06 DIAGNOSIS — M9903 Segmental and somatic dysfunction of lumbar region: Secondary | ICD-10-CM | POA: Diagnosis not present

## 2021-12-25 DIAGNOSIS — M5126 Other intervertebral disc displacement, lumbar region: Secondary | ICD-10-CM | POA: Diagnosis not present

## 2021-12-25 DIAGNOSIS — Z6836 Body mass index (BMI) 36.0-36.9, adult: Secondary | ICD-10-CM | POA: Diagnosis not present

## 2022-01-14 DIAGNOSIS — M5127 Other intervertebral disc displacement, lumbosacral region: Secondary | ICD-10-CM | POA: Diagnosis not present

## 2022-01-14 DIAGNOSIS — M5126 Other intervertebral disc displacement, lumbar region: Secondary | ICD-10-CM | POA: Diagnosis not present
